# Patient Record
Sex: Male | Born: 1945 | Race: White | Hispanic: No | Smoking: Former smoker
Health system: Southern US, Community
[De-identification: ages and names within clinical notes are randomized; demographics above are authoritative.]

## PROBLEM LIST (undated history)

## (undated) DIAGNOSIS — I10 Essential (primary) hypertension: Secondary | ICD-10-CM

## (undated) DIAGNOSIS — M1990 Unspecified osteoarthritis, unspecified site: Secondary | ICD-10-CM

## (undated) DIAGNOSIS — R42 Dizziness and giddiness: Secondary | ICD-10-CM

## (undated) DIAGNOSIS — J449 Chronic obstructive pulmonary disease, unspecified: Secondary | ICD-10-CM

## (undated) HISTORY — PX: KNEE ARTHROSCOPY: SUR90

---

## 2003-01-16 ENCOUNTER — Emergency Department (HOSPITAL_COMMUNITY): Admission: EM | Admit: 2003-01-16 | Discharge: 2003-01-16 | Payer: Self-pay | Admitting: Emergency Medicine

## 2014-09-17 ENCOUNTER — Emergency Department (INDEPENDENT_AMBULATORY_CARE_PROVIDER_SITE_OTHER)
Admission: EM | Admit: 2014-09-17 | Discharge: 2014-09-17 | Disposition: A | Discharge status: Home / Self Care | Payer: 59 | Source: Home / Self Care | Attending: Family Medicine | Admitting: Family Medicine

## 2014-09-17 ENCOUNTER — Encounter (HOSPITAL_COMMUNITY): Payer: Self-pay | Admitting: Emergency Medicine

## 2014-09-17 DIAGNOSIS — I10 Essential (primary) hypertension: Secondary | ICD-10-CM

## 2014-09-17 DIAGNOSIS — I160 Hypertensive urgency: Secondary | ICD-10-CM

## 2014-09-17 HISTORY — DX: Essential (primary) hypertension: I10

## 2014-09-17 LAB — POCT I-STAT, CHEM 8
BUN: 10 mg/dL (ref 6–23)
CREATININE: 0.6 mg/dL (ref 0.50–1.35)
Calcium, Ion: 1.07 mmol/L — ABNORMAL LOW (ref 1.13–1.30)
Chloride: 104 mEq/L (ref 96–112)
GLUCOSE: 101 mg/dL — AB (ref 70–99)
HCT: 49 % (ref 39.0–52.0)
HEMOGLOBIN: 16.7 g/dL (ref 13.0–17.0)
POTASSIUM: 3.8 meq/L (ref 3.7–5.3)
Sodium: 138 mEq/L (ref 137–147)
TCO2: 22 mmol/L (ref 0–100)

## 2014-09-17 MED ORDER — CLONIDINE HCL 0.1 MG PO TABS
ORAL_TABLET | ORAL | Status: AC
  Filled 2014-09-17: qty 1

## 2014-09-17 MED ORDER — HYDROCHLOROTHIAZIDE 12.5 MG PO TABS: 12.5000 mg | ORAL_TABLET | Freq: Every day | ORAL | Status: DC

## 2014-09-17 MED ORDER — CLONIDINE HCL 0.1 MG PO TABS
0.1000 mg | ORAL_TABLET | Freq: Once | ORAL | Status: AC
  Administered 2014-09-17: 0.1 mg via ORAL

## 2014-09-17 NOTE — Discharge Instructions (Signed)
Your blood pressure was extremely elevated Please start the hydrochlorothiazide for your blood pressure This medicine may make you urinate more frequently Please call your primary doctor for a follow up appointment in 2 weeks Please stop the medicine if it makes you lightheaded or pass out.

## 2014-09-17 NOTE — ED Provider Notes (Addendum)
CSN: [id number]     Arrival date & time 09/17/14  1218 History   First MD Initiated Contact with Patient 09/17/14 1348     Chief Complaint  Patient presents with  . Hypertension   (Consider location/radiation/quality/duration/timing/severity/associated sxs/prior Treatment) HPI  Pt had eye appt today at work and was told BP was very high >200 systolic and that he needed to get checked out. Previously on BP medications (10 years ago) but stopped after BP normalized. Has had a few high BPs in the recent past but nothing as high as today. Deniea CP, palpitations, HA, syncope, lightheadedenss.    Past Medical History  Diagnosis Date  . Hypertension    Past Surgical History  Procedure Laterality Date  . Knee arthroscopy Right    Family History  Problem Relation Age of Onset  . Hypertension Mother   . Hypertension Father   . Hypertension Brother    History  Substance Use Topics  . Smoking status: Current Every Day Smoker -- 1.00 packs/day    Types: Cigarettes  . Smokeless tobacco: Never Used  . Alcohol Use: 4.2 oz/week    7 Shots of liquor per week    Review of Systems Per HPI with all other pertinent systems negative.   Allergies  Codeine  Home Medications   Prior to Admission medications   Medication Sig Start Date End Date Taking? Authorizing Provider  hydrochlorothiazide (HYDRODIURIL) 12.5 MG tablet Take 1 tablet (12.5 mg total) by mouth daily. 09/17/14   Ozella Rocksavid J Kemond Amorin, MD   BP 208/104 mmHg  Pulse 99  Temp(Src) 98.5 F (36.9 C) (Oral)  SpO2 96% Physical Exam  Constitutional: He is oriented to person, place, and time. He appears well-developed and well-nourished.  HENT:  Head: Normocephalic and atraumatic.  Eyes: EOM are normal. Pupils are equal, round, and reactive to light.  Neck: Normal range of motion.  Cardiovascular: Normal rate and normal heart sounds.   No murmur heard. Pulmonary/Chest: Effort normal and breath sounds normal. No respiratory  distress.  Abdominal: Soft. Bowel sounds are normal.  Musculoskeletal: Normal range of motion.  Neurological: He is alert and oriented to person, place, and time.  Skin: Skin is warm and dry. There is pallor.  Psychiatric: He has a normal mood and affect. His behavior is normal. Judgment and thought content normal.    ED Course  Procedures (including critical care time) Labs Review Labs Reviewed  POCT I-STAT, CHEM 8 - Abnormal; Notable for the following:    Glucose, Bld 101 (*)    Calcium, Ion 1.07 (*)    All other components within normal limits    Imaging Review No results found.   MDM   1. Hypertensive urgency    Clonidine 0.1mg  PO w/ safe reduction in BP (190/90) Pt to start HCTZ 12.5 today I stat Chem 8 w/ nml Cr and K Precautions given and all questions answered  Shelly Flattenavid Jaicee Michelotti, MD Family Medicine 09/17/2014, 2:28 PM      Ozella Rocksavid J Azhane Eckart, MD 09/17/14 1428  Ozella Rocksavid J Jesiel Garate, MD 09/17/14 1434

## 2014-09-17 NOTE — ED Notes (Signed)
Pt came from audiology with BP's 200/98 & 204/100 on recheck.  His BP here to day is 210/109.  Pt is asymptomatic.  He denies CP, SOB, dizziness, headache, or blurred vision.

## 2019-05-11 ENCOUNTER — Telehealth: Payer: Self-pay | Admitting: *Deleted

## 2019-05-11 ENCOUNTER — Ambulatory Visit: Payer: Medicare Other | Admitting: Neurology

## 2019-05-11 NOTE — Telephone Encounter (Signed)
No showed new patient appointment. 

## 2019-06-12 ENCOUNTER — Ambulatory Visit: Payer: Self-pay | Admitting: Physician Assistant

## 2019-06-12 NOTE — H&P (Signed)
TOTAL KNEE ADMISSION H&P  Patient is being admitted for right total knee arthroplasty.  Subjective:  Chief Complaint:right knee pain.  HPI: Hanad Edward Wieman, 73 y.o. male, has a history of pain and functional disability in the right knee due to arthritis and has failed non-surgical conservative treatments for greater than 12 weeks to includecorticosteriod injections and activity modification.  Onset of symptoms was gradual, starting 9 years ago with gradually worsening course since that time. The patient noted prior procedures on the knee to include  arthroscopy on the right knee(s).  Patient currently rates pain in the right knee(s) at 8 out of 10 with activity. Patient has night pain, worsening of pain with activity and weight bearing, pain that interferes with activities of daily living, pain with passive range of motion, crepitus and joint swelling.  Patient has evidence of periarticular osteophytes and joint space narrowing by imaging studies.There is no active infection.  There are no active problems to display for this patient.  Past Medical History:  Diagnosis Date  . Hypertension     Past Surgical History:  Procedure Laterality Date  . KNEE ARTHROSCOPY Right     No current outpatient medications on file.   No current facility-administered medications for this visit.    Allergies  Allergen Reactions  . Codeine Swelling    Social History   Tobacco Use  . Smoking status: Current Every Day Smoker    Packs/day: 1.00    Types: Cigarettes  . Smokeless tobacco: Never Used  Substance Use Topics  . Alcohol use: Yes    Alcohol/week: 7.0 standard drinks    Types: 7 Shots of liquor per week    Family History  Problem Relation Age of Onset  . Hypertension Mother   . Hypertension Father   . Hypertension Brother      Review of Systems  Constitutional: Positive for weight loss.  HENT: Positive for hearing loss.   Cardiovascular: Positive for leg swelling.   Musculoskeletal: Positive for joint pain.  Endo/Heme/Allergies: Bruises/bleeds easily.  All other systems reviewed and are negative.   Objective:  Physical Exam  Constitutional: He is oriented to person, place, and time. He appears well-developed and well-nourished. No distress.  HENT:  Head: Normocephalic and atraumatic.  Eyes: Pupils are equal, round, and reactive to light. Conjunctivae and EOM are normal.  Neck: Normal range of motion. Neck supple.  Cardiovascular: Normal rate, regular rhythm, normal heart sounds and intact distal pulses.  Respiratory: Accessory muscle usage present.  Diffuse stridor or rales secondary to COPD  GI: Soft. Bowel sounds are normal. He exhibits no distension. There is no abdominal tenderness.  Musculoskeletal:     Right knee: He exhibits swelling and bony tenderness. He exhibits no effusion and no erythema. Tenderness found.  Lymphadenopathy:    He has no cervical adenopathy.  Neurological: He is alert and oriented to person, place, and time.  Skin: Skin is warm and dry. No rash noted. No erythema.  Psychiatric: He has a normal mood and affect. His behavior is normal.    Vital signs in last 24 hours: @VSRANGES@  Labs:   There is no height or weight on file to calculate BMI.   Imaging Review Plain radiographs demonstrate severe degenerative joint disease of the right knee(s). The overall alignment ismild varus. The bone quality appears to be good for age and reported activity level.      Assessment/Plan:  End stage arthritis, right knee   The patient history, physical examination, clinical judgment   of the provider and imaging studies are consistent with end stage degenerative joint disease of the right knee(s) and total knee arthroplasty is deemed medically necessary. The treatment options including medical management, injection therapy arthroscopy and arthroplasty were discussed at length. The risks and benefits of total knee  arthroplasty were presented and reviewed. The risks due to aseptic loosening, infection, stiffness, patella tracking problems, thromboembolic complications and other imponderables were discussed. The patient acknowledged the explanation, agreed to proceed with the plan and consent was signed. Patient is being admitted for inpatient treatment for surgery, pain control, PT, OT, prophylactic antibiotics, VTE prophylaxis, progressive ambulation and ADL's and discharge planning. The patient is planning to be discharged home with home health services    Anticipated LOS equal to or greater than 2 midnights due to - Age 79 and older with one or more of the following:  - Obesity  - Expected need for hospital services (PT, OT, Nursing) required for safe  discharge  - Anticipated need for postoperative skilled nursing care or inpatient rehab  - Active co-morbidities: Respiratory Failure/COPD OR   - Unanticipated findings during/Post Surgery: None  - Patient is a high risk of re-admission due to: None

## 2019-06-12 NOTE — H&P (View-Only) (Signed)
TOTAL KNEE ADMISSION H&P  Patient is being admitted for right total knee arthroplasty.  Subjective:  Chief Complaint:right knee pain.  HPI: Melvin EllisRalph Edward Thomas, 73 y.o. male, has a history of pain and functional disability in the right knee due to arthritis and has failed non-surgical conservative treatments for greater than 12 weeks to includecorticosteriod injections and activity modification.  Onset of symptoms was gradual, starting 9 years ago with gradually worsening course since that time. The patient noted prior procedures on the knee to include  arthroscopy on the right knee(s).  Patient currently rates pain in the right knee(s) at 8 out of 10 with activity. Patient has night pain, worsening of pain with activity and weight bearing, pain that interferes with activities of daily living, pain with passive range of motion, crepitus and joint swelling.  Patient has evidence of periarticular osteophytes and joint space narrowing by imaging studies.There is no active infection.  There are no active problems to display for this patient.  Past Medical History:  Diagnosis Date  . Hypertension     Past Surgical History:  Procedure Laterality Date  . KNEE ARTHROSCOPY Right     No current outpatient medications on file.   No current facility-administered medications for this visit.    Allergies  Allergen Reactions  . Codeine Swelling    Social History   Tobacco Use  . Smoking status: Current Every Day Smoker    Packs/day: 1.00    Types: Cigarettes  . Smokeless tobacco: Never Used  Substance Use Topics  . Alcohol use: Yes    Alcohol/week: 7.0 standard drinks    Types: 7 Shots of liquor per week    Family History  Problem Relation Age of Onset  . Hypertension Mother   . Hypertension Father   . Hypertension Brother      Review of Systems  Constitutional: Positive for weight loss.  HENT: Positive for hearing loss.   Cardiovascular: Positive for leg swelling.   Musculoskeletal: Positive for joint pain.  Endo/Heme/Allergies: Bruises/bleeds easily.  All other systems reviewed and are negative.   Objective:  Physical Exam  Constitutional: He is oriented to person, place, and time. He appears well-developed and well-nourished. No distress.  HENT:  Head: Normocephalic and atraumatic.  Eyes: Pupils are equal, round, and reactive to light. Conjunctivae and EOM are normal.  Neck: Normal range of motion. Neck supple.  Cardiovascular: Normal rate, regular rhythm, normal heart sounds and intact distal pulses.  Respiratory: Accessory muscle usage present.  Diffuse stridor or rales secondary to COPD  GI: Soft. Bowel sounds are normal. He exhibits no distension. There is no abdominal tenderness.  Musculoskeletal:     Right knee: He exhibits swelling and bony tenderness. He exhibits no effusion and no erythema. Tenderness found.  Lymphadenopathy:    He has no cervical adenopathy.  Neurological: He is alert and oriented to person, place, and time.  Skin: Skin is warm and dry. No rash noted. No erythema.  Psychiatric: He has a normal mood and affect. His behavior is normal.    Vital signs in last 24 hours: @VSRANGES @  Labs:   There is no height or weight on file to calculate BMI.   Imaging Review Plain radiographs demonstrate severe degenerative joint disease of the right knee(s). The overall alignment ismild varus. The bone quality appears to be good for age and reported activity level.      Assessment/Plan:  End stage arthritis, right knee   The patient history, physical examination, clinical judgment  of the provider and imaging studies are consistent with end stage degenerative joint disease of the right knee(s) and total knee arthroplasty is deemed medically necessary. The treatment options including medical management, injection therapy arthroscopy and arthroplasty were discussed at length. The risks and benefits of total knee  arthroplasty were presented and reviewed. The risks due to aseptic loosening, infection, stiffness, patella tracking problems, thromboembolic complications and other imponderables were discussed. The patient acknowledged the explanation, agreed to proceed with the plan and consent was signed. Patient is being admitted for inpatient treatment for surgery, pain control, PT, OT, prophylactic antibiotics, VTE prophylaxis, progressive ambulation and ADL's and discharge planning. The patient is planning to be discharged home with home health services    Anticipated LOS equal to or greater than 2 midnights due to - Age 71 and older with one or more of the following:  - Obesity  - Expected need for hospital services (PT, OT, Nursing) required for safe  discharge  - Anticipated need for postoperative skilled nursing care or inpatient rehab  - Active co-morbidities: Respiratory Failure/COPD OR   - Unanticipated findings during/Post Surgery: None  - Patient is a high risk of re-admission due to: None

## 2019-06-14 NOTE — Patient Instructions (Addendum)
YOU NEED TO HAVE A COVID 19 TEST ON_8-25-20______ @_______ , THIS TEST MUST BE DONE BEFORE SURGERY, COME  [address], [place] , [phone]. ONCE YOUR COVID TEST IS COMPLETED, PLEASE BEGIN THE QUARANTINE INSTRUCTIONS AS OUTLINED IN YOUR HANDOUT.                Melvin EllisRalph Edward Thomas    Your procedure is scheduled on: 06-23-2019  [facility]              Report to admitting at 745 AM   1 VISITOR IS ALLOWED TO WAIT IN WAITING ROOM  ONLY DAY OF YOUR SURGERY. NO VISITORS ARE ALLOWED IN SHORT STAY OR RECOVERY ROOM.   Call this number if you have problems the morning of surgery [phone]    Remember: BRUSH YOUR TEETH MORNING OF SURGERY AND RINSE YOUR MOUTH OUT, NO CHEWING GUM CANDY OR MINTS.   NO SOLID FOOD AFTER MIDNIGHT THE NIGHT PRIOR TO SURGERY. NOTHING BY MOUTH EXCEPT CLEAR LIQUIDS UNTIL 715 AM . PLEASE FINISH ENSURE DRINK PER SURGEON ORDER  WHICH NEEDS TO BE COMPLETED AT 715 AM .  Then nothing by mouth   CLEAR LIQUID DIET   Foods Allowed                                                                     Foods Excluded  Coffee and tea, regular and decaf                             liquids that you cannot  Plain Jell-O any favor except red or purple                                           see through such as: Fruit ices (not with fruit pulp)                                     milk, soups, orange juice  Iced Popsicles                                    All solid food Carbonated beverages, regular and diet                                    Cranberry, grape and apple juices Sports drinks like Gatorade Lightly seasoned clear broth or consume(fat free) Sugar, honey syrup  Sample Menu Breakfast                                Lunch                                     Supper Cranberry juice  Beef broth                            Chicken broth Jell-O                                     Grape juice                            Apple juice Coffee or tea                        Jell-O                                      Popsicle                                                Coffee or tea                        Coffee or tea  _____________________________________________________________________     Take these medicines the morning of surgery with A SIP OF WATER: inhaler and bring with you                                You may not have any metal on your body including hair pins and              piercings  Do not wear jewelry,  lotions, powders or perfumes, deodorant                         Men may shave face and neck.   Do not bring valuables to the hospital. [place] IS NOT             RESPONSIBLE   FOR VALUABLES.  Contacts, dentures or bridgework may not be worn into surgery.    ____________________________________________________________________           [facility] - Preparing for Surgery Before surgery, you can play an important role.  Because skin is not sterile, your skin needs to be as free of germs as possible.  You can reduce the number of germs on your skin by washing with CHG (chlorahexidine gluconate) soap before surgery.  CHG is an antiseptic cleaner which kills germs and bonds with the skin to continue killing germs even after washing. Please DO NOT use if you have an allergy to CHG or antibacterial soaps.  If your skin becomes reddened/irritated stop using the CHG and inform your nurse when you arrive at Short Stay. Do not shave (including legs and underarms) for at least 48 hours prior to the first CHG shower.  You may shave your face/neck. Please follow these instructions carefully:  1.  Shower with CHG Soap the night before surgery and the  morning of Surgery.  2.  If you choose to wash your hair, wash your hair first as usual with your  normal  shampoo.  3.  After you shampoo, rinse your hair  and body thoroughly to remove the  shampoo.                           4.  Use CHG as you would  any other liquid soap.  You can apply chg directly  to the skin and wash                       Gently with a scrungie or clean washcloth.  5.  Apply the CHG Soap to your body ONLY FROM THE NECK DOWN.   Do not use on face/ open                           Wound or open sores. Avoid contact with eyes, ears mouth and genitals (private parts).                       Wash face,  Genitals (private parts) with your normal soap.             6.  Wash thoroughly, paying special attention to the area where your surgery  will be performed.  7.  Thoroughly rinse your body with warm water from the neck down.  8.  DO NOT shower/wash with your normal soap after using and rinsing off  the CHG Soap.                9.  Pat yourself dry with a clean towel.            10.  Wear clean pajamas.            11.  Place clean sheets on your bed the night of your first shower and do not  sleep with pets. Day of Surgery : Do not apply any lotions/deodorants the morning of surgery.  Please wear clean clothes to the hospital/surgery center.  FAILURE TO FOLLOW THESE INSTRUCTIONS MAY RESULT IN THE CANCELLATION OF YOUR SURGERY PATIENT SIGNATURE_________________________________  NURSE SIGNATURE__________________________________  ________________________________________________________________________   Melvin MireIncentive Spirometer  An incentive spirometer is a tool that can help keep your lungs clear and active. This tool measures how well you are filling your lungs with each breath. Taking long deep breaths may help reverse or decrease the chance of developing breathing (pulmonary) problems (especially infection) following:  A long period of time when you are unable to move or be active. BEFORE THE PROCEDURE   If the spirometer includes an indicator to show your best effort, your nurse or respiratory therapist will set it to a desired goal.  If possible, sit up straight or lean slightly forward. Try not to slouch.  Hold the  incentive spirometer in an upright position. INSTRUCTIONS FOR USE  1. Sit on the edge of your bed if possible, or sit up as far as you can in bed or on a chair. 2. Hold the incentive spirometer in an upright position. 3. Breathe out normally. 4. Place the mouthpiece in your mouth and seal your lips tightly around it. 5. Breathe in slowly and as deeply as possible, raising the piston or the ball toward the top of the column. 6. Hold your breath for 3-5 seconds or for as long as possible. Allow the piston or ball to fall to the bottom of the column. 7. Remove the mouthpiece from your mouth and breathe out normally. 8. Rest for a few seconds and repeat Steps  1 through 7 at least 10 times every 1-2 hours when you are awake. Take your time and take a few normal breaths between deep breaths. 9. The spirometer may include an indicator to show your best effort. Use the indicator as a goal to work toward during each repetition. 10. After each set of 10 deep breaths, practice coughing to be sure your lungs are clear. If you have an incision (the cut made at the time of surgery), support your incision when coughing by placing a pillow or rolled up towels firmly against it. Once you are able to get out of bed, walk around indoors and cough well. You may stop using the incentive spirometer when instructed by your caregiver.  RISKS AND COMPLICATIONS  Take your time so you do not get dizzy or light-headed.  If you are in pain, you may need to take or ask for pain medication before doing incentive spirometry. It is harder to take a deep breath if you are having pain. AFTER USE  Rest and breathe slowly and easily.  It can be helpful to keep track of a log of your progress. Your caregiver can provide you with a simple table to help with this. If you are using the spirometer at home, follow these instructions: SEEK MEDICAL CARE IF:   You are having difficultly using the spirometer.  You have trouble using  the spirometer as often as instructed.  Your pain medication is not giving enough relief while using the spirometer.  You develop fever of 100.5 F (38.1 C) or higher. SEEK IMMEDIATE MEDICAL CARE IF:   You cough up bloody sputum that had not been present before.  You develop fever of 102 F (38.9 C) or greater.  You develop worsening pain at or near the incision site. MAKE SURE YOU:   Understand these instructions.  Will watch your condition.  Will get help right away if you are not doing well or get worse. Document Released: 02/22/2007 Document Revised: 01/04/2012 Document Reviewed: 04/25/2007 ExitCare Patient Information 2014 [employer], [place].   ________________________________________________________________________  WHAT IS A BLOOD TRANSFUSION? Blood Transfusion Information  A transfusion is the replacement of blood or some of its parts. Blood is made up of multiple cells which provide different functions.  Red blood cells carry oxygen and are used for blood loss replacement.  White blood cells fight against infection.  Platelets control bleeding.  Plasma helps clot blood.  Other blood products are available for specialized needs, such as hemophilia or other clotting disorders. BEFORE THE TRANSFUSION  Who gives blood for transfusions?   Healthy volunteers who are fully evaluated to make sure their blood is safe. This is blood bank blood. Transfusion therapy is the safest it has ever been in the practice of medicine. Before blood is taken from a donor, a complete history is taken to make sure that person has no history of diseases nor engages in risky social behavior (examples are intravenous drug use or sexual activity with multiple partners). The donor's travel history is screened to minimize risk of transmitting infections, such as malaria. The donated blood is tested for signs of infectious diseases, such as HIV and hepatitis. The blood is then tested to be sure it  is compatible with you in order to minimize the chance of a transfusion reaction. If you or a relative donates blood, this is often done in anticipation of surgery and is not appropriate for emergency situations. It takes many days to process the donated blood. RISKS  AND COMPLICATIONS Although transfusion therapy is very safe and saves many lives, the main dangers of transfusion include:   Getting an infectious disease.  Developing a transfusion reaction. This is an allergic reaction to something in the blood you were given. Every precaution is taken to prevent this. The decision to have a blood transfusion has been considered carefully by your caregiver before blood is given. Blood is not given unless the benefits outweigh the risks. AFTER THE TRANSFUSION  Right after receiving a blood transfusion, you will usually feel much better and more energetic. This is especially true if your red blood cells have gotten low (anemic). The transfusion raises the level of the red blood cells which carry oxygen, and this usually causes an energy increase.  The nurse administering the transfusion will monitor you carefully for complications. HOME CARE INSTRUCTIONS  No special instructions are needed after a transfusion. You may find your energy is better. Speak with your caregiver about any limitations on activity for underlying diseases you may have. SEEK MEDICAL CARE IF:   Your condition is not improving after your transfusion.  You develop redness or irritation at the intravenous (IV) site. SEEK IMMEDIATE MEDICAL CARE IF:  Any of the following symptoms occur over the next 12 hours:  Shaking chills.  You have a temperature by mouth above 102 F (38.9 C), not controlled by medicine.  Chest, back, or muscle pain.  People around you feel you are not acting correctly or are confused.  Shortness of breath or difficulty breathing.  Dizziness and fainting.  You get a rash or develop hives.  You  have a decrease in urine output.  Your urine turns a dark color or changes to pink, red, or brown. Any of the following symptoms occur over the next 10 days:  You have a temperature by mouth above 102 F (38.9 C), not controlled by medicine.  Shortness of breath.  Weakness after normal activity.  The white part of the eye turns yellow (jaundice).  You have a decrease in the amount of urine or are urinating less often.  Your urine turns a dark color or changes to pink, red, or brown. Document Released: 10/09/2000 Document Revised: 01/04/2012 Document Reviewed: 05/28/2008 [facility] Patient Information 2014 [place], [place].  _______________________________________________________________________

## 2019-06-15 ENCOUNTER — Encounter (INDEPENDENT_AMBULATORY_CARE_PROVIDER_SITE_OTHER): Payer: Self-pay

## 2019-06-15 ENCOUNTER — Encounter (HOSPITAL_COMMUNITY): Payer: Self-pay

## 2019-06-15 ENCOUNTER — Other Ambulatory Visit: Payer: Self-pay

## 2019-06-15 ENCOUNTER — Encounter (HOSPITAL_COMMUNITY)
Admission: RE | Admit: 2019-06-15 | Discharge: 2019-06-15 | Disposition: A | Discharge status: Home / Self Care | Payer: Medicare Other | Source: Ambulatory Visit | Attending: Orthopedic Surgery | Admitting: Orthopedic Surgery

## 2019-06-15 DIAGNOSIS — R9431 Abnormal electrocardiogram [ECG] [EKG]: Secondary | ICD-10-CM | POA: Insufficient documentation

## 2019-06-15 DIAGNOSIS — M1711 Unilateral primary osteoarthritis, right knee: Secondary | ICD-10-CM | POA: Diagnosis not present

## 2019-06-15 DIAGNOSIS — Z01818 Encounter for other preprocedural examination: Secondary | ICD-10-CM | POA: Insufficient documentation

## 2019-06-15 HISTORY — DX: Dizziness and giddiness: R42

## 2019-06-15 HISTORY — DX: Chronic obstructive pulmonary disease, unspecified: J44.9

## 2019-06-15 HISTORY — DX: Unspecified osteoarthritis, unspecified site: M19.90

## 2019-06-15 LAB — COMPREHENSIVE METABOLIC PANEL
ALT: 41 U/L (ref 0–44)
AST: 106 U/L — ABNORMAL HIGH (ref 15–41)
Albumin: 3.8 g/dL (ref 3.5–5.0)
Alkaline Phosphatase: 141 U/L — ABNORMAL HIGH (ref 38–126)
Anion gap: 9 (ref 5–15)
BUN: 10 mg/dL (ref 8–23)
CO2: 27 mmol/L (ref 22–32)
Calcium: 8.6 mg/dL — ABNORMAL LOW (ref 8.9–10.3)
Chloride: 102 mmol/L (ref 98–111)
Creatinine, Ser: 0.67 mg/dL (ref 0.61–1.24)
GFR calc Af Amer: >60 mL/min (ref >60)
GFR calc non Af Amer: >60 mL/min (ref >60)
Glucose, Bld: 100 mg/dL — ABNORMAL HIGH (ref 70–99)
Potassium: 4.5 mmol/L (ref 3.5–5.1)
Sodium: 138 mmol/L (ref 135–145)
Total Bilirubin: 1.4 mg/dL — ABNORMAL HIGH (ref 0.3–1.2)
Total Protein: 7.4 g/dL (ref 6.5–8.1)

## 2019-06-15 LAB — CBC WITH DIFFERENTIAL/PLATELET
Abs Immature Granulocytes: 0.03 10*3/uL (ref 0.00–0.07)
Basophils Absolute: 0 10*3/uL (ref 0.0–0.1)
Basophils Relative: 1 %
Eosinophils Absolute: 0.1 10*3/uL (ref 0.0–0.5)
Eosinophils Relative: 1 %
HCT: 46.6 % (ref 39.0–52.0)
Hemoglobin: 15.8 g/dL (ref 13.0–17.0)
Immature Granulocytes: 0 %
Lymphocytes Relative: 24 %
Lymphs Abs: 1.6 10*3/uL (ref 0.7–4.0)
MCH: 38.7 pg — ABNORMAL HIGH (ref 26.0–34.0)
MCHC: 33.9 g/dL (ref 30.0–36.0)
MCV: 114.2 fL — ABNORMAL HIGH (ref 80.0–100.0)
Monocytes Absolute: 0.9 10*3/uL (ref 0.1–1.0)
Monocytes Relative: 13 %
Neutro Abs: 4.2 10*3/uL (ref 1.7–7.7)
Neutrophils Relative %: 61 %
Platelets: 134 10*3/uL — ABNORMAL LOW (ref 150–400)
RBC: 4.08 MIL/uL — ABNORMAL LOW (ref 4.22–5.81)
RDW: 15.9 % — ABNORMAL HIGH (ref 11.5–15.5)
WBC: 6.8 10*3/uL (ref 4.0–10.5)
nRBC: 0 % (ref 0.0–0.2)

## 2019-06-15 LAB — URINALYSIS, ROUTINE W REFLEX MICROSCOPIC
Glucose, UA: NEGATIVE mg/dL
Hgb urine dipstick: NEGATIVE
Ketones, ur: 5 mg/dL — AB
Nitrite: NEGATIVE
Protein, ur: NEGATIVE mg/dL
Specific Gravity, Urine: 1.021 (ref 1.005–1.030)
WBC, UA: >50 WBC/hpf — ABNORMAL HIGH (ref 0–5)
pH: 5 (ref 5.0–8.0)

## 2019-06-15 LAB — ABO/RH: ABO/RH(D): O POS

## 2019-06-15 LAB — PROTIME-INR
INR: 1 (ref 0.8–1.2)
Prothrombin Time: 12.9 seconds (ref 11.4–15.2)

## 2019-06-15 LAB — SURGICAL PCR SCREEN
MRSA, PCR: NEGATIVE
Staphylococcus aureus: POSITIVE — AB

## 2019-06-15 LAB — APTT: aPTT: 34 seconds (ref 24–36)

## 2019-06-16 LAB — URINE CULTURE: Culture: NO GROWTH

## 2019-06-20 ENCOUNTER — Other Ambulatory Visit (HOSPITAL_COMMUNITY)
Admission: RE | Admit: 2019-06-20 | Discharge: 2019-06-20 | Disposition: A | Discharge status: Home / Self Care | Payer: Medicare Other | Source: Ambulatory Visit | Attending: Orthopedic Surgery | Admitting: Orthopedic Surgery

## 2019-06-20 DIAGNOSIS — Z01812 Encounter for preprocedural laboratory examination: Secondary | ICD-10-CM | POA: Diagnosis not present

## 2019-06-20 DIAGNOSIS — Z20828 Contact with and (suspected) exposure to other viral communicable diseases: Secondary | ICD-10-CM | POA: Diagnosis not present

## 2019-06-20 LAB — SARS CORONAVIRUS 2 (TAT 6-24 HRS): SARS Coronavirus 2: NEGATIVE

## 2019-06-22 MED ORDER — BUPIVACAINE LIPOSOME 1.3 % IJ SUSP
20.0000 mL | Freq: Once | INTRAMUSCULAR | Status: DC
  Filled 2019-06-22: qty 20

## 2019-06-22 MED ORDER — TRANEXAMIC ACID 1000 MG/10ML IV SOLN
2000.0000 mg | INTRAVENOUS | Status: DC
  Filled 2019-06-22: qty 20

## 2019-06-23 ENCOUNTER — Ambulatory Visit (HOSPITAL_COMMUNITY): Payer: Medicare Other | Admitting: Physician Assistant

## 2019-06-23 ENCOUNTER — Observation Stay (HOSPITAL_COMMUNITY)
Admission: RE | Admit: 2019-06-23 | Discharge: 2019-06-24 | Disposition: A | Discharge status: Home Health | Payer: Medicare Other | Source: Other Acute Inpatient Hospital | Attending: Orthopedic Surgery | Admitting: Orthopedic Surgery

## 2019-06-23 ENCOUNTER — Other Ambulatory Visit: Payer: Self-pay

## 2019-06-23 ENCOUNTER — Encounter (HOSPITAL_COMMUNITY): Payer: Self-pay

## 2019-06-23 ENCOUNTER — Ambulatory Visit (HOSPITAL_COMMUNITY): Payer: Medicare Other | Admitting: Registered Nurse

## 2019-06-23 ENCOUNTER — Encounter (HOSPITAL_COMMUNITY)
Admission: RE | Disposition: A | Discharge status: Home Health | Payer: Self-pay | Source: Other Acute Inpatient Hospital | Attending: Orthopedic Surgery

## 2019-06-23 DIAGNOSIS — M24561 Contracture, right knee: Secondary | ICD-10-CM | POA: Insufficient documentation

## 2019-06-23 DIAGNOSIS — M1711 Unilateral primary osteoarthritis, right knee: Principal | ICD-10-CM | POA: Diagnosis present

## 2019-06-23 DIAGNOSIS — I10 Essential (primary) hypertension: Secondary | ICD-10-CM | POA: Insufficient documentation

## 2019-06-23 DIAGNOSIS — Z79899 Other long term (current) drug therapy: Secondary | ICD-10-CM | POA: Diagnosis not present

## 2019-06-23 DIAGNOSIS — F17210 Nicotine dependence, cigarettes, uncomplicated: Secondary | ICD-10-CM | POA: Diagnosis not present

## 2019-06-23 DIAGNOSIS — J449 Chronic obstructive pulmonary disease, unspecified: Secondary | ICD-10-CM | POA: Insufficient documentation

## 2019-06-23 DIAGNOSIS — M25761 Osteophyte, right knee: Secondary | ICD-10-CM | POA: Diagnosis not present

## 2019-06-23 DIAGNOSIS — M25461 Effusion, right knee: Secondary | ICD-10-CM | POA: Diagnosis not present

## 2019-06-23 DIAGNOSIS — M21161 Varus deformity, not elsewhere classified, right knee: Secondary | ICD-10-CM | POA: Insufficient documentation

## 2019-06-23 HISTORY — PX: TOTAL KNEE ARTHROPLASTY: SHX125

## 2019-06-23 LAB — TYPE AND SCREEN
ABO/RH(D): O POS
Antibody Screen: NEGATIVE

## 2019-06-23 SURGERY — ARTHROPLASTY, KNEE, TOTAL
Anesthesia: Spinal | Laterality: Right

## 2019-06-23 MED ORDER — PROPOFOL 10 MG/ML IV BOLUS
INTRAVENOUS | Status: DC | PRN
  Administered 2019-06-23: 20 mg via INTRAVENOUS
  Administered 2019-06-23: 30 mg via INTRAVENOUS

## 2019-06-23 MED ORDER — TRANEXAMIC ACID-NACL 1000-0.7 MG/100ML-% IV SOLN
1000.0000 mg | Freq: Once | INTRAVENOUS | Status: AC
  Administered 2019-06-23: 15:00:00 1000 mg via INTRAVENOUS
  Filled 2019-06-23: qty 100

## 2019-06-23 MED ORDER — HYDROMORPHONE HCL 1 MG/ML IJ SOLN: 0.5000 mg | INTRAMUSCULAR | Status: DC | PRN

## 2019-06-23 MED ORDER — VANCOMYCIN HCL IN DEXTROSE 1-5 GM/200ML-% IV SOLN
1000.0000 mg | Freq: Two times a day (BID) | INTRAVENOUS | Status: AC
  Administered 2019-06-23: 21:00:00 1000 mg via INTRAVENOUS
  Filled 2019-06-23: qty 200

## 2019-06-23 MED ORDER — 0.9 % SODIUM CHLORIDE (POUR BTL) OPTIME
TOPICAL | Status: DC | PRN
  Administered 2019-06-23: 1000 mL

## 2019-06-23 MED ORDER — DOCUSATE SODIUM 100 MG PO CAPS: 100.0000 mg | ORAL_CAPSULE | Freq: Every day | ORAL | 2 refills | Status: DC | PRN

## 2019-06-23 MED ORDER — LACTATED RINGERS IV SOLN: INTRAVENOUS | Status: DC | PRN

## 2019-06-23 MED ORDER — METOCLOPRAMIDE HCL 5 MG PO TABS: 5.0000 mg | ORAL_TABLET | Freq: Three times a day (TID) | ORAL | Status: DC | PRN

## 2019-06-23 MED ORDER — DOCUSATE SODIUM 100 MG PO CAPS
100.0000 mg | ORAL_CAPSULE | Freq: Two times a day (BID) | ORAL | Status: DC
  Administered 2019-06-23 – 2019-06-24 (×2): 100 mg via ORAL
  Filled 2019-06-23 (×2): qty 1

## 2019-06-23 MED ORDER — METOCLOPRAMIDE HCL 5 MG/ML IJ SOLN: 5.0000 mg | Freq: Three times a day (TID) | INTRAMUSCULAR | Status: DC | PRN

## 2019-06-23 MED ORDER — UMECLIDINIUM-VILANTEROL 62.5-25 MCG/INH IN AEPB
1.0000 | INHALATION_SPRAY | Freq: Every day | RESPIRATORY_TRACT | Status: DC
  Filled 2019-06-23: qty 14

## 2019-06-23 MED ORDER — BUPIVACAINE-EPINEPHRINE 0.5% -1:200000 IJ SOLN
INTRAMUSCULAR | Status: AC
  Filled 2019-06-23: qty 1

## 2019-06-23 MED ORDER — BUPIVACAINE IN DEXTROSE 0.75-8.25 % IT SOLN
INTRATHECAL | Status: DC | PRN
  Administered 2019-06-23: 1.6 mL via INTRATHECAL

## 2019-06-23 MED ORDER — PROPOFOL 10 MG/ML IV BOLUS
INTRAVENOUS | Status: AC
  Filled 2019-06-23: qty 20

## 2019-06-23 MED ORDER — KETOROLAC TROMETHAMINE 15 MG/ML IJ SOLN: 15.0000 mg | Freq: Once | INTRAMUSCULAR | Status: DC

## 2019-06-23 MED ORDER — DEXAMETHASONE SODIUM PHOSPHATE 10 MG/ML IJ SOLN
INTRAMUSCULAR | Status: DC | PRN
  Administered 2019-06-23: 8 mg via INTRAVENOUS

## 2019-06-23 MED ORDER — ROPIVACAINE HCL 5 MG/ML IJ SOLN
INTRAMUSCULAR | Status: DC | PRN
  Administered 2019-06-23 (×2): 5 mL via PERINEURAL

## 2019-06-23 MED ORDER — CEFAZOLIN SODIUM-DEXTROSE 2-4 GM/100ML-% IV SOLN
2.0000 g | INTRAVENOUS | Status: AC
  Administered 2019-06-23: 11:00:00 2 g via INTRAVENOUS
  Filled 2019-06-23: qty 100

## 2019-06-23 MED ORDER — LACTATED RINGERS IV SOLN
INTRAVENOUS | Status: DC | PRN
  Administered 2019-06-23: 12:00:00 via INTRAVENOUS

## 2019-06-23 MED ORDER — TRANEXAMIC ACID 1000 MG/10ML IV SOLN
INTRAVENOUS | Status: DC | PRN
  Administered 2019-06-23: 2000 mg via TOPICAL

## 2019-06-23 MED ORDER — ACETAMINOPHEN 325 MG PO TABS: 325.0000 mg | ORAL_TABLET | Freq: Four times a day (QID) | ORAL | Status: DC | PRN

## 2019-06-23 MED ORDER — VANCOMYCIN HCL 1000 MG IV SOLR
INTRAVENOUS | Status: DC | PRN
  Administered 2019-06-23: 2000 mg

## 2019-06-23 MED ORDER — EPHEDRINE SULFATE 50 MG/ML IJ SOLN
INTRAMUSCULAR | Status: DC | PRN
  Administered 2019-06-23 (×2): 10 mg via INTRAVENOUS

## 2019-06-23 MED ORDER — SODIUM CHLORIDE 0.9 % IV SOLN
INTRAVENOUS | Status: DC
  Administered 2019-06-23: 75 mL/h via INTRAVENOUS
  Administered 2019-06-24: 04:00:00 via INTRAVENOUS

## 2019-06-23 MED ORDER — ACETAMINOPHEN 325 MG PO TABS: 650.0000 mg | ORAL_TABLET | ORAL | 2 refills | Status: DC | PRN

## 2019-06-23 MED ORDER — ONDANSETRON HCL 4 MG PO TABS: 4.0000 mg | ORAL_TABLET | Freq: Four times a day (QID) | ORAL | Status: DC | PRN

## 2019-06-23 MED ORDER — CLONIDINE HCL (ANALGESIA) 100 MCG/ML EP SOLN
EPIDURAL | Status: DC | PRN
  Administered 2019-06-23: 50 ug

## 2019-06-23 MED ORDER — FENTANYL CITRATE (PF) 100 MCG/2ML IJ SOLN
50.0000 ug | INTRAMUSCULAR | Status: DC
  Administered 2019-06-23 (×2): 50 ug via INTRAVENOUS
  Filled 2019-06-23: qty 2

## 2019-06-23 MED ORDER — VANCOMYCIN HCL IN DEXTROSE 1-5 GM/200ML-% IV SOLN
INTRAVENOUS | Status: AC
  Filled 2019-06-23: qty 200

## 2019-06-23 MED ORDER — MENTHOL 3 MG MT LOZG: 1.0000 | LOZENGE | OROMUCOSAL | Status: DC | PRN

## 2019-06-23 MED ORDER — ONDANSETRON HCL 4 MG/2ML IJ SOLN
INTRAMUSCULAR | Status: DC | PRN
  Administered 2019-06-23: 4 mg via INTRAVENOUS

## 2019-06-23 MED ORDER — OXYCODONE HCL 5 MG PO TABS
5.0000 mg | ORAL_TABLET | ORAL | Status: DC | PRN
  Administered 2019-06-23: 5 mg via ORAL
  Administered 2019-06-24 (×2): 10 mg via ORAL
  Filled 2019-06-23: qty 1
  Filled 2019-06-23 (×2): qty 2

## 2019-06-23 MED ORDER — TRANEXAMIC ACID-NACL 1000-0.7 MG/100ML-% IV SOLN
1000.0000 mg | INTRAVENOUS | Status: AC
  Administered 2019-06-23: 11:00:00 1000 mg via INTRAVENOUS
  Filled 2019-06-23: qty 100

## 2019-06-23 MED ORDER — VANCOMYCIN HCL 1000 MG IV SOLR
INTRAVENOUS | Status: AC
  Filled 2019-06-23: qty 2000

## 2019-06-23 MED ORDER — PROPOFOL 500 MG/50ML IV EMUL
INTRAVENOUS | Status: DC | PRN
  Administered 2019-06-23 (×2): 75 ug/kg/min via INTRAVENOUS

## 2019-06-23 MED ORDER — MEPERIDINE HCL 50 MG/ML IJ SOLN: 6.2500 mg | INTRAMUSCULAR | Status: DC | PRN

## 2019-06-23 MED ORDER — PROPOFOL 10 MG/ML IV BOLUS
INTRAVENOUS | Status: AC
  Filled 2019-06-23: qty 60

## 2019-06-23 MED ORDER — OXYCODONE HCL 5 MG PO TABS: ORAL_TABLET | ORAL | 0 refills | Status: DC

## 2019-06-23 MED ORDER — SODIUM CHLORIDE (PF) 0.9 % IJ SOLN
INTRAMUSCULAR | Status: AC
  Filled 2019-06-23: qty 50

## 2019-06-23 MED ORDER — WATER FOR IRRIGATION, STERILE IR SOLN
Status: DC | PRN
  Administered 2019-06-23: 2000 mL

## 2019-06-23 MED ORDER — VANCOMYCIN HCL IN DEXTROSE 1-5 GM/200ML-% IV SOLN
1000.0000 mg | Freq: Once | INTRAVENOUS | Status: AC
  Administered 2019-06-23: 11:00:00 1000 mg via INTRAVENOUS

## 2019-06-23 MED ORDER — CHLORHEXIDINE GLUCONATE 4 % EX LIQD: 60.0000 mL | Freq: Once | CUTANEOUS | Status: DC

## 2019-06-23 MED ORDER — PROMETHAZINE HCL 25 MG/ML IJ SOLN: 6.2500 mg | INTRAMUSCULAR | Status: DC | PRN

## 2019-06-23 MED ORDER — PHENOL 1.4 % MT LIQD: 1.0000 | OROMUCOSAL | Status: DC | PRN

## 2019-06-23 MED ORDER — BUPIVACAINE-EPINEPHRINE 0.5% -1:200000 IJ SOLN
INTRAMUSCULAR | Status: DC | PRN
  Administered 2019-06-23: 30 mL

## 2019-06-23 MED ORDER — ASPIRIN EC 81 MG PO TBEC: 81.0000 mg | DELAYED_RELEASE_TABLET | Freq: Two times a day (BID) | ORAL | 0 refills | Status: DC

## 2019-06-23 MED ORDER — ACETAMINOPHEN 500 MG PO TABS
1000.0000 mg | ORAL_TABLET | Freq: Four times a day (QID) | ORAL | Status: AC
  Administered 2019-06-23 – 2019-06-24 (×4): 1000 mg via ORAL
  Filled 2019-06-23 (×4): qty 2

## 2019-06-23 MED ORDER — POVIDONE-IODINE 10 % EX SWAB: 2.0000 "application " | Freq: Once | CUTANEOUS | Status: DC

## 2019-06-23 MED ORDER — DEXAMETHASONE SODIUM PHOSPHATE 10 MG/ML IJ SOLN
INTRAMUSCULAR | Status: AC
  Filled 2019-06-23: qty 1

## 2019-06-23 MED ORDER — LIDOCAINE 2% (20 MG/ML) 5 ML SYRINGE
INTRAMUSCULAR | Status: AC
  Filled 2019-06-23: qty 5

## 2019-06-23 MED ORDER — MIDAZOLAM HCL 2 MG/2ML IJ SOLN
1.0000 mg | INTRAMUSCULAR | Status: DC
  Administered 2019-06-23 (×2): 1 mg via INTRAVENOUS
  Filled 2019-06-23: qty 2

## 2019-06-23 MED ORDER — BISACODYL 10 MG RE SUPP: 10.0000 mg | Freq: Every day | RECTAL | Status: DC | PRN

## 2019-06-23 MED ORDER — BUPIVACAINE LIPOSOME 1.3 % IJ SUSP
INTRAMUSCULAR | Status: DC | PRN
  Administered 2019-06-23: 20 mL

## 2019-06-23 MED ORDER — ONDANSETRON HCL 4 MG/2ML IJ SOLN
INTRAMUSCULAR | Status: AC
  Filled 2019-06-23: qty 2

## 2019-06-23 MED ORDER — LIDOCAINE 2% (20 MG/ML) 5 ML SYRINGE
INTRAMUSCULAR | Status: DC | PRN
  Administered 2019-06-23: 40 mg via INTRAVENOUS

## 2019-06-23 MED ORDER — DIPHENHYDRAMINE HCL 12.5 MG/5ML PO ELIX: 12.5000 mg | ORAL_SOLUTION | ORAL | Status: DC | PRN

## 2019-06-23 MED ORDER — FENTANYL CITRATE (PF) 100 MCG/2ML IJ SOLN: 25.0000 ug | INTRAMUSCULAR | Status: DC | PRN

## 2019-06-23 MED ORDER — POLYETHYLENE GLYCOL 3350 17 G PO PACK: 17.0000 g | PACK | Freq: Every day | ORAL | Status: DC | PRN

## 2019-06-23 MED ORDER — SODIUM CHLORIDE 0.9 % IV SOLN
INTRAVENOUS | Status: DC
  Administered 2019-06-23: 08:00:00 via INTRAVENOUS
  Administered 2019-06-23: 1000 mL via INTRAVENOUS

## 2019-06-23 MED ORDER — ONDANSETRON HCL 4 MG/2ML IJ SOLN: 4.0000 mg | Freq: Four times a day (QID) | INTRAMUSCULAR | Status: DC | PRN

## 2019-06-23 MED ORDER — SODIUM CHLORIDE 0.9% FLUSH
INTRAVENOUS | Status: DC | PRN
  Administered 2019-06-23: 50 mL

## 2019-06-23 MED ORDER — SODIUM CHLORIDE 0.9 % IR SOLN
Status: DC | PRN
  Administered 2019-06-23: 1000 mL

## 2019-06-23 MED ORDER — ASPIRIN 81 MG PO CHEW
81.0000 mg | CHEWABLE_TABLET | Freq: Two times a day (BID) | ORAL | Status: DC
  Administered 2019-06-23 – 2019-06-24 (×2): 81 mg via ORAL
  Filled 2019-06-23 (×2): qty 1

## 2019-06-23 MED ORDER — FLEET ENEMA 7-19 GM/118ML RE ENEM: 1.0000 | ENEMA | Freq: Once | RECTAL | Status: DC | PRN

## 2019-06-23 MED ORDER — ROPIVACAINE HCL 7.5 MG/ML IJ SOLN
INTRAMUSCULAR | Status: DC | PRN
  Administered 2019-06-23 (×4): 5 mL via PERINEURAL

## 2019-06-23 SURGICAL SUPPLY — 66 items
ATTUNE MED DOME PAT 41 KNEE (Knees) ×1 IMPLANT
ATTUNE MED DOME PAT 41MM KNEE (Knees) ×1 IMPLANT
ATTUNE PS FEM RT SZ 6 CEM KNEE (Femur) ×2 IMPLANT
ATTUNE PSRP INSR SZ6 6 KNEE (Insert) ×1 IMPLANT
ATTUNE PSRP INSR SZ6 6MM KNEE (Insert) ×1 IMPLANT
BAG DECANTER FOR FLEXI CONT (MISCELLANEOUS) ×3 IMPLANT
BAG ZIPLOCK 12X15 (MISCELLANEOUS) ×3 IMPLANT
BASE TIBIA ATTUNE KNEE SYS SZ6 (Knees) IMPLANT
BENZOIN TINCTURE PRP APPL 2/3 (GAUZE/BANDAGES/DRESSINGS) ×3 IMPLANT
BLADE SAGITTAL 25.0X1.19X90 (BLADE) ×2 IMPLANT
BLADE SAGITTAL 25.0X1.19X90MM (BLADE) ×1
BLADE SAW SGTL 13X75X1.27 (BLADE) ×3 IMPLANT
BLADE SURG 15 STRL LF DISP TIS (BLADE) ×1 IMPLANT
BLADE SURG 15 STRL SS (BLADE) ×2
BLADE SURG SZ10 CARB STEEL (BLADE) ×9 IMPLANT
BNDG ELASTIC 6X15 VLCR STRL LF (GAUZE/BANDAGES/DRESSINGS) ×3 IMPLANT
BOWL SMART MIX CTS (DISPOSABLE) ×3 IMPLANT
CATH FOLEY 2WAY SLVR  5CC 14FR (CATHETERS) ×2
CATH FOLEY 2WAY SLVR 5CC 14FR (CATHETERS) IMPLANT
CEMENT HV SMART SET (Cement) ×4 IMPLANT
CLOSURE STERI-STRIP 1/2X4 (GAUZE/BANDAGES/DRESSINGS) ×2
CLSR STERI-STRIP ANTIMIC 1/2X4 (GAUZE/BANDAGES/DRESSINGS) ×4 IMPLANT
COVER SURGICAL LIGHT HANDLE (MISCELLANEOUS) ×5 IMPLANT
COVER WAND RF STERILE (DRAPES) ×3 IMPLANT
CUFF TOURN SGL QUICK 34 (TOURNIQUET CUFF) ×2
CUFF TRNQT CYL 34X4.125X (TOURNIQUET CUFF) ×1 IMPLANT
DECANTER SPIKE VIAL GLASS SM (MISCELLANEOUS) ×6 IMPLANT
DRAPE INCISE IOBAN 66X45 STRL (DRAPES) IMPLANT
DRAPE ORTHO SPLIT 77X108 STRL (DRAPES) ×4
DRAPE SURG ORHT 6 SPLT 77X108 (DRAPES) ×2 IMPLANT
DRAPE U-SHAPE 47X51 STRL (DRAPES) ×3 IMPLANT
DRESSING AQUACEL AG SP 3.5X10 (GAUZE/BANDAGES/DRESSINGS) ×1 IMPLANT
DRSG AQUACEL AG SP 3.5X10 (GAUZE/BANDAGES/DRESSINGS) ×3
DURAPREP 26ML APPLICATOR (WOUND CARE) ×6 IMPLANT
ELECT REM PT RETURN 15FT ADLT (MISCELLANEOUS) ×3 IMPLANT
GLOVE BIOGEL PI IND STRL 8 (GLOVE) ×2 IMPLANT
GLOVE BIOGEL PI INDICATOR 8 (GLOVE) ×4
GLOVE SURG ORTHO 8.0 STRL STRW (GLOVE) ×3 IMPLANT
GLOVE SURG SS PI 7.5 STRL IVOR (GLOVE) ×3 IMPLANT
GOWN STRL REUS W/TWL XL LVL3 (GOWN DISPOSABLE) ×6 IMPLANT
HANDPIECE INTERPULSE COAX TIP (DISPOSABLE) ×2
HOLDER FOLEY CATH W/STRAP (MISCELLANEOUS) IMPLANT
HOOD PEEL AWAY FLYTE STAYCOOL (MISCELLANEOUS) ×3 IMPLANT
IMMOBILIZER KNEE 20 (SOFTGOODS) ×3
IMMOBILIZER KNEE 20 THIGH 36 (SOFTGOODS) ×1 IMPLANT
KIT TURNOVER KIT A (KITS) IMPLANT
MANIFOLD NEPTUNE II (INSTRUMENTS) ×3 IMPLANT
NEEDLE HYPO 22GX1.5 SAFETY (NEEDLE) ×6 IMPLANT
NS IRRIG 1000ML POUR BTL (IV SOLUTION) ×3 IMPLANT
PACK ICE MAXI GEL EZY WRAP (MISCELLANEOUS) ×3 IMPLANT
PACK TOTAL KNEE CUSTOM (KITS) ×3 IMPLANT
PIN DRILL FIX HALF THREAD (BIT) ×2 IMPLANT
PIN STEINMAN FIXATION KNEE (PIN) ×2 IMPLANT
PROTECTOR NERVE ULNAR (MISCELLANEOUS) ×3 IMPLANT
SET HNDPC FAN SPRY TIP SCT (DISPOSABLE) ×1 IMPLANT
SUT ETHIBOND NAB CT1 #1 30IN (SUTURE) ×9 IMPLANT
SUT MNCRL AB 3-0 PS2 18 (SUTURE) ×3 IMPLANT
SUT VIC AB 0 CT1 36 (SUTURE) ×3 IMPLANT
SUT VIC AB 2-0 CT1 27 (SUTURE) ×4
SUT VIC AB 2-0 CT1 TAPERPNT 27 (SUTURE) ×2 IMPLANT
SYR CONTROL 10ML LL (SYRINGE) ×6 IMPLANT
TIBIA ATTUNE KNEE SYS BASE SZ6 (Knees) ×3 IMPLANT
TRAY FOLEY MTR SLVR 16FR STAT (SET/KITS/TRAYS/PACK) ×3 IMPLANT
WATER STERILE IRR 1000ML POUR (IV SOLUTION) ×6 IMPLANT
WRAP KNEE MAXI GEL POST OP (GAUZE/BANDAGES/DRESSINGS) ×2 IMPLANT
YANKAUER SUCT BULB TIP 10FT TU (MISCELLANEOUS) ×3 IMPLANT

## 2019-06-23 NOTE — Transfer of Care (Signed)
Immediate Anesthesia Transfer of Care Note  Patient: Melvin Thomas  Procedure(s) Performed: TOTAL KNEE ARTHROPLASTY (Right )  Patient Location: PACU  Anesthesia Type:Spinal  Level of Consciousness: awake, alert  and oriented  Airway & Oxygen Therapy: Patient Spontanous Breathing and Patient connected to face mask oxygen  Post-op Assessment: Report given to RN and Post -op Vital signs reviewed and stable  Post vital signs: Reviewed and stable  Last Vitals:  Vitals Value Taken Time  BP    Temp    Pulse    Resp    SpO2      Last Pain:  Vitals:   06/23/19 0812  TempSrc:   PainSc: 1       Patients Stated Pain Goal: 4 (64/40/34 7425)  Complications: No apparent anesthesia complications

## 2019-06-23 NOTE — Anesthesia Procedure Notes (Signed)
Procedure Name: MAC Date/Time: 06/23/2019 11:26 AM Performed by: Eben Burow, CRNA Pre-anesthesia Checklist: Patient identified, Emergency Drugs available, Suction available, Patient being monitored and Timeout performed Oxygen Delivery Method: Simple face mask Dental Injury: Teeth and Oropharynx as per pre-operative assessment

## 2019-06-23 NOTE — Anesthesia Preprocedure Evaluation (Signed)
Anesthesia Evaluation  Patient identified by MRN, date of birth, ID band Patient awake    Reviewed: Allergy & Precautions, NPO status , Patient's Chart, lab work & pertinent test results  Airway Mallampati: I       Dental no notable dental hx. (+) Teeth Intact   Pulmonary COPD,  COPD inhaler, former smoker,    Pulmonary exam normal breath sounds clear to auscultation       Cardiovascular hypertension, Normal cardiovascular exam Rhythm:Regular Rate:Normal     Neuro/Psych negative neurological ROS  negative psych ROS   GI/Hepatic negative GI ROS, Neg liver ROS,   Endo/Other  negative endocrine ROS  Renal/GU negative Renal ROS  negative genitourinary   Musculoskeletal  (+) Arthritis , Osteoarthritis,    Abdominal Normal abdominal exam  (+)   Peds  Hematology negative hematology ROS (+)   Anesthesia Other Findings   Reproductive/Obstetrics                             Anesthesia Physical Anesthesia Plan  ASA: II  Anesthesia Plan: Spinal   Post-op Pain Management:  Regional for Post-op pain   Induction:   PONV Risk Score and Plan: 1 and Ondansetron, Dexamethasone and Midazolam  Airway Management Planned: Nasal Cannula and Natural Airway  Additional Equipment: None  Intra-op Plan:   Post-operative Plan:   Informed Consent: I have reviewed the patients History and Physical, chart, labs and discussed the procedure including the risks, benefits and alternatives for the proposed anesthesia with the patient or authorized representative who has indicated his/her understanding and acceptance.       Plan Discussed with: CRNA  Anesthesia Plan Comments:         Anesthesia Quick Evaluation

## 2019-06-23 NOTE — Evaluation (Signed)
Physical Therapy Evaluation Patient Details Name: Melvin Thomas MRN: [id number] DOB: 14-Feb-1946 Today's Date: 06/23/2019   History of Present Illness  73 yo male s/p R TKR on 06/23/19. PMH includes HTN, R knee arthroscopy.  Clinical Impression   Pt presents with R knee pain, decreased R knee ROM, difficulty performing mobility tasks, increased time and effort to mobilize, and decreased activity tolerance. Pt to benefit from acute PT to address deficits. Pt ambulated hallway distance with RW with min guard assist, verbal cuing for form and safety provided throughout. Pt educated on ankle pumps (20/hour) to perform this afternoon/evening to increase circulation, to pt's tolerance and limited by pain. PT to progress mobility as tolerated, and will continue to follow acutely.        Follow Up Recommendations Follow surgeon's recommendation for DC plan and follow-up therapies;Supervision for mobility/OOB(HHPT)    Equipment Recommendations  None recommended by PT    Recommendations for Other Services       Precautions / Restrictions Precautions Precautions: Fall Required Braces or Orthoses: Knee Immobilizer - Right Knee Immobilizer - Right: On when out of bed or walking Restrictions Weight Bearing Restrictions: No Other Position/Activity Restrictions: WBAT      Mobility  Bed Mobility Overal bed mobility: Needs Assistance Bed Mobility: Supine to Sit     Supine to sit: Min guard;HOB elevated     General bed mobility comments: Min guard for safety, verbal cuing for sequencing.  Transfers Overall transfer level: Needs assistance Equipment used: Rolling walker (2 wheeled) Transfers: Sit to/from Stand Sit to Stand: Min guard;From elevated surface         General transfer comment: Min guard for safety, verbal cuing for hand placement when rising.  Ambulation/Gait Ambulation/Gait assistance: Min guard Gait Distance (Feet): 80 Feet Assistive device: Rolling walker (2  wheeled) Gait Pattern/deviations: Step-to pattern;Decreased step length - right;Decreased step length - left;Antalgic;Trunk flexed Gait velocity: decr   General Gait Details: Min guard for safety, verbal cuing for sequencing, placement in RW, turning with RW, and upright posture.  Stairs            Wheelchair Mobility    Modified Rankin (Stroke Patients Only)       Balance Overall balance assessment: Mild deficits observed, not formally tested                                           Pertinent Vitals/Pain Pain Assessment: 0-10 Pain Score: 1  Pain Location: R knee Pain Descriptors / Indicators: Sore Pain Intervention(s): Limited activity within patient's tolerance;Monitored during session;Premedicated before session;Repositioned;Ice applied    Home Living Family/patient expects to be discharged to:: Private residence Living Arrangements: Alone Available Help at Discharge: Family;Available PRN/intermittently Type of Home: House Home Access: Stairs to enter   [employer] of Steps: 2+1 Home Layout: One level Home Equipment: Walker - 2 wheels;Cane - single point;Bedside commode      Prior Function Level of Independence: Independent with assistive device(s)         Comments: pt reports using cane for ambulation as needed PTA     Hand Dominance   Dominant Hand: Right    Extremity/Trunk Assessment   Upper Extremity Assessment Upper Extremity Assessment: Overall WFL for tasks assessed    Lower Extremity Assessment Lower Extremity Assessment: Overall WFL for tasks assessed;RLE deficits/detail RLE Deficits / Details: suspected post-surgical weakness; able to perform ankle  pumps, quad set, heel slide to 45* (limited by incisional bleeding), SLR without lift assist RLE Sensation: WNL    Cervical / Trunk Assessment Cervical / Trunk Assessment: Normal  Communication   Communication: No difficulties  Cognition Arousal/Alertness:  Awake/alert Behavior During Therapy: WFL for tasks assessed/performed Overall Cognitive Status: Within Functional Limits for tasks assessed                                        General Comments      Exercises     Assessment/Plan    PT Assessment Patient needs continued PT services  PT Problem List Decreased strength;Decreased mobility;Decreased range of motion;Decreased activity tolerance;Decreased balance;Decreased knowledge of use of DME;Pain       PT Treatment Interventions DME instruction;Therapeutic activities;Gait training;Therapeutic exercise;Patient/family education;Balance training;Functional mobility training;Stair training    PT Goals (Current goals can be found in the Care Plan section)  Acute Rehab PT Goals Patient Stated Goal: go home PT Goal Formulation: With patient Time For Goal Achievement: 06/30/19 Potential to Achieve Goals: Good    Frequency 7X/week   Barriers to discharge        Co-evaluation               AM-PAC PT "6 Clicks" Mobility  Outcome Measure Help needed turning from your back to your side while in a flat bed without using bedrails?: A Little Help needed moving from lying on your back to sitting on the side of a flat bed without using bedrails?: A Little Help needed moving to and from a bed to a chair (including a wheelchair)?: A Little Help needed standing up from a chair using your arms (e.g., wheelchair or bedside chair)?: A Little Help needed to walk in hospital room?: A Little Help needed climbing 3-5 steps with a railing? : A Little 6 Click Score: 18    End of Session Equipment Utilized During Treatment: Gait belt;Right knee immobilizer Activity Tolerance: Patient tolerated treatment well Patient left: in chair;with chair alarm set;with call bell/phone within reach;with SCD's reapplied(SCD on RLE not applied due to bleeding from dressing, reinforced by RN) Nurse Communication: Mobility status PT Visit  Diagnosis: Difficulty in walking, not elsewhere classified (R26.2);Other abnormalities of gait and mobility (R26.89)    Time: [id number] PT Time Calculation (min) (ACUTE ONLY): 20 min   Charges:   PT Evaluation $PT Eval Low Complexity: 1 Low         Maylani Embree Terrial Rhodes Atonya Templer, PT Acute Rehabilitation Services Pager [phone]  Office [phone]  Telina Kleckley D Despina Hiddenure 06/23/2019, 5:35 PM

## 2019-06-23 NOTE — Plan of Care (Signed)
  Problem: Education: Goal: Knowledge of General Education information will improve Description: Including pain rating scale, medication(s)/side effects and non-pharmacologic comfort measures Outcome: Progressing   Problem: Health Behavior/Discharge Planning: Goal: Ability to manage health-related needs will improve Outcome: Progressing   Problem: Activity: Goal: Risk for activity intolerance will decrease Outcome: Progressing   Problem: Pain Managment: Goal: General experience of comfort will improve Outcome: Progressing   Problem: Education: Goal: Knowledge of the prescribed therapeutic regimen will improve Outcome: Progressing

## 2019-06-23 NOTE — Anesthesia Procedure Notes (Signed)
Spinal  Patient location during procedure: OR Start time: 06/23/2019 11:10 AM End time: 06/23/2019 11:14 AM Staffing Anesthesiologist: Lyn Hollingshead, MD Resident/CRNA: Talbot Grumbling, CRNA Performed: resident/CRNA  Preanesthetic Checklist Completed: patient identified, site marked, surgical consent, pre-op evaluation, timeout performed, IV checked, risks and benefits discussed and monitors and equipment checked Spinal Block Patient position: sitting Prep: DuraPrep Patient monitoring: heart rate, cardiac monitor, continuous pulse ox and blood pressure Approach: midline Location: L3-4 Injection technique: single-shot Needle Needle type: Pencan  Needle gauge: 24 G Needle length: 9 cm Assessment Sensory level: T4 Additional Notes Clear CSF, no paresthesia, patient tolerated well.

## 2019-06-23 NOTE — Brief Op Note (Signed)
06/23/2019  1:12 PM  PATIENT:  Ina Homes  73 y.o. male  PRE-OPERATIVE DIAGNOSIS:  OA RIGHT KNEE  POST-OPERATIVE DIAGNOSIS:  OA RIGHT KNEE  PROCEDURE:  Procedure(s): TOTAL KNEE ARTHROPLASTY (Right)  SURGEON:  Surgeon(s) and Role:    Earlie Server, MD - Primary  PHYSICIAN ASSISTANT: Chriss Czar, PA-C  ASSISTANTS: OR staff x1   ANESTHESIA:   local, regional, spinal and IV sedation  EBL:  50 mL   BLOOD ADMINISTERED:none  DRAINS: none   LOCAL MEDICATIONS USED:  MARCAINE     SPECIMEN:  Source of Specimen:  synovium right knee and Biopsy / Limited Resection  DISPOSITION OF SPECIMEN:  PATHOLOGY  COUNTS:  YES  TOURNIQUET:   Total Tourniquet Time Documented: Thigh (Right) - 53 minutes Total: Thigh (Right) - 53 minutes   DICTATION: .Other Dictation: Dictation Number unknown  PLAN OF CARE: Admit for overnight observation  PATIENT DISPOSITION:  PACU - hemodynamically stable.   Delay start of Pharmacological VTE agent (>24hrs) due to surgical blood loss or risk of bleeding: yes

## 2019-06-23 NOTE — Interval H&P Note (Signed)
History and Physical Interval Note:  06/23/2019 9:58 AM  Melvin Thomas  has presented today for surgery, with the diagnosis of OA RIGHT KNEE.  The various methods of treatment have been discussed with the patient and family. After consideration of risks, benefits and other options for treatment, the patient has consented to  Procedure(s): TOTAL KNEE ARTHROPLASTY (Right) as a surgical intervention.  The patient's history has been reviewed, patient examined, no change in status, stable for surgery.  I have reviewed the patient's chart and labs.  Questions were answered to the patient's satisfaction.     Yvette Rack

## 2019-06-23 NOTE — Anesthesia Procedure Notes (Signed)
Anesthesia Regional Block: Adductor canal block   Pre-Anesthetic Checklist: ,, timeout performed, Correct Patient, Correct Site, Correct Laterality, Correct Procedure, Correct Position, site marked, Risks and benefits discussed,  Surgical consent,  Pre-op evaluation,  At surgeon's request and post-op pain management  Laterality: Lower and Right  Prep: chloraprep       Needles:  Injection technique: Single-shot  Needle Type: Echogenic Stimulator Needle     Needle Length: 10cm  Needle Gauge: 21   Needle insertion depth: 2 cm   Additional Needles:   Procedures:,,,, ultrasound used (permanent image in chart),,,,  Narrative:  Start time: 06/23/2019 9:00 AM End time: 06/23/2019 9:10 AM Injection made incrementally with aspirations every 5 mL.  Performed by: Personally  Anesthesiologist: Lyn Hollingshead, MD

## 2019-06-23 NOTE — Op Note (Signed)
Melvin Thomas, Melvin Thomas MEDICAL RECORD NI:[id number] ACCOUNT [id number] DATE OF BIRTH:[birth year] FACILITY: [facility] LOCATION: [facility] PHYSICIAN:W. Ravleen Ries JR., MD  OPERATIVE REPORT  DATE OF PROCEDURE:  06/23/2019  PREOPERATIVE  DIAGNOSIS:  Severe osteoarthritis, right knee with varus deformity, flexion contracture.  OPERATION:  Right total knee replacement (Attune cemented knee size 6 femur, tibia size 6 mm bearing with 41 mm all poly patella.  SURGEON:  Vangie Bicker, MD  ASSISTANTMarjo Bicker.  ANESTHESIA:  Spinal anesthetic.  TOURNIQUET TIME:  50 minutes.  DESCRIPTION OF PROCEDURE:  Straight skin incision with medial parapatellar approach through the knee, extended medial release due to the severe varus deformity and flexion contracture.  I did a 9 mm 5 degree valgus cut on the femur followed by cutting  about 9 mm below the least diseased lateral compartment.  Extension gap with full extension noted at 6 mm.  Femur was sized to be a size 6 with placement of all-in-1 cutting block in appropriate degree of external rotation followed by placement of the  anterior, posterior chamfer cuts.  PCL was completely released.  Excess menisci and posterior osteophytes removed.  Flexion gap equaled the extension gap at 6 mm.  A keel hole was cut for the tibia followed by placement of tibial trial.  Box cut for the  femur and then the trial femur.  The patella was cut, resecting 9.5 mm patella for a 41 mm all poly trial.  Full extension was noted on the table with good stability and no tendency for bearing instability was noted.  Final components were inserted after  the trial tibia followed by femur and patella.  The cement was allowed to harden with the trial insert in.  The trial insert was removed.  When the cement was hardened, small bits of cement were removed from the posterior aspect of the knee.  Tourniquet  was released under direct vision, no excessive bleeding was noted.  Final  bearing was placed.  Again, stability deemed to be excellent.  Closure was affected with #1 Ethibond, 2-0 Vicryl and Monocryl.  A lightly compressive sterile dressing applied,  taken to recovery room in stable condition.  TN/NUANCE  D:06/23/2019 T:06/23/2019 JOB:[id number]

## 2019-06-23 NOTE — Anesthesia Postprocedure Evaluation (Signed)
Anesthesia Post Note  Patient: Sael Furches  Procedure(s) Performed: TOTAL KNEE ARTHROPLASTY (Right )     Patient location during evaluation: PACU Anesthesia Type: Spinal Level of consciousness: awake Pain management: pain level controlled Vital Signs Assessment: post-procedure vital signs reviewed and stable Respiratory status: spontaneous breathing Cardiovascular status: stable Postop Assessment: no headache, no backache, spinal receding and no apparent nausea or vomiting Anesthetic complications: no    Last Vitals:  Vitals:   06/23/19 1415 06/23/19 1430  BP: 133/71 137/72  Pulse: 68 68  Resp: 18 16  Temp: 36.5 C 36.5 C  SpO2: 96% 96%    Last Pain:  Vitals:   06/23/19 1430  TempSrc: Oral  PainSc:    Pain Goal: Patients Stated Pain Goal: 4 (06/23/19 0812)    LLE Sensation: Decreased (06/23/19 1415)   RLE Sensation: Decreased (06/23/19 1415) L Sensory Level: L3-Anterior knee, lower leg (06/23/19 1415) R Sensory Level: L3-Anterior knee, lower leg (06/23/19 1415)    Huston Foley

## 2019-06-23 NOTE — Progress Notes (Signed)
Assisted Dr. Hatchett with right, ultrasound guided, adductor canal block. Side rails up, monitors on throughout procedure. See vital signs in flow sheet. Tolerated Procedure well.  

## 2019-06-23 NOTE — Discharge Instructions (Signed)

## 2019-06-24 DIAGNOSIS — M1711 Unilateral primary osteoarthritis, right knee: Secondary | ICD-10-CM | POA: Diagnosis not present

## 2019-06-24 LAB — BASIC METABOLIC PANEL
Anion gap: 9 (ref 5–15)
BUN: 12 mg/dL (ref 8–23)
CO2: 23 mmol/L (ref 22–32)
Calcium: 7.8 mg/dL — ABNORMAL LOW (ref 8.9–10.3)
Chloride: 104 mmol/L (ref 98–111)
Creatinine, Ser: 0.7 mg/dL (ref 0.61–1.24)
GFR calc Af Amer: >60 mL/min (ref >60)
GFR calc non Af Amer: >60 mL/min (ref >60)
Glucose, Bld: 134 mg/dL — ABNORMAL HIGH (ref 70–99)
Potassium: 4.5 mmol/L (ref 3.5–5.1)
Sodium: 136 mmol/L (ref 135–145)

## 2019-06-24 LAB — CBC
HCT: 36.3 % — ABNORMAL LOW (ref 39.0–52.0)
Hemoglobin: 12.1 g/dL — ABNORMAL LOW (ref 13.0–17.0)
MCH: 38.1 pg — ABNORMAL HIGH (ref 26.0–34.0)
MCHC: 33.3 g/dL (ref 30.0–36.0)
MCV: 114.2 fL — ABNORMAL HIGH (ref 80.0–100.0)
Platelets: 132 10*3/uL — ABNORMAL LOW (ref 150–400)
RBC: 3.18 MIL/uL — ABNORMAL LOW (ref 4.22–5.81)
RDW: 14.9 % (ref 11.5–15.5)
WBC: 5.2 10*3/uL (ref 4.0–10.5)
nRBC: 0 % (ref 0.0–0.2)

## 2019-06-24 MED ORDER — POLYETHYLENE GLYCOL 3350 17 G PO PACK: PACK | ORAL | 0 refills | Status: DC

## 2019-06-24 MED ORDER — ACETAMINOPHEN 325 MG PO TABS: 650.0000 mg | ORAL_TABLET | ORAL | 2 refills | Status: AC | PRN

## 2019-06-24 MED ORDER — SODIUM CHLORIDE 0.9 % IV BOLUS
500.0000 mL | Freq: Once | INTRAVENOUS | Status: AC
  Administered 2019-06-24: 09:00:00 500 mL via INTRAVENOUS

## 2019-06-24 MED ORDER — OXYCODONE HCL 5 MG PO TABS: ORAL_TABLET | ORAL | 0 refills | Status: AC

## 2019-06-24 MED ORDER — DOCUSATE SODIUM 100 MG PO CAPS: 100.0000 mg | ORAL_CAPSULE | Freq: Two times a day (BID) | ORAL | 0 refills | Status: AC

## 2019-06-24 MED ORDER — POLYETHYLENE GLYCOL 3350 17 G PO PACK: PACK | ORAL | 0 refills | Status: AC

## 2019-06-24 MED ORDER — ASPIRIN EC 81 MG PO TBEC: 81.0000 mg | DELAYED_RELEASE_TABLET | Freq: Two times a day (BID) | ORAL | 0 refills | Status: AC

## 2019-06-24 NOTE — Evaluation (Signed)
Occupational Therapy Evaluation Patient Details Name: Melvin EllisRalph Edward Thomas MRN: [id number] DOB: 02/10/1946 Today's Date: 06/24/2019    History of Present Illness 73 yo male s/p R TKR on 06/23/19. PMH includes HTN, R knee arthroscopy.   Clinical Impression   Pt mod I with ADL activity.  Education provided for LB dressing as well as technique for getting in and out of shower    Follow Up Recommendations  No OT follow up    Equipment Recommendations  None recommended by OT    Recommendations for Other Services       Precautions / Restrictions Precautions Precautions: Fall Required Braces or Orthoses: Knee Immobilizer - Right Knee Immobilizer - Right: On when out of bed or walking Restrictions Weight Bearing Restrictions: No Other Position/Activity Restrictions: WBAT      Mobility Bed Mobility               General bed mobility comments: pt in chair  Transfers Overall transfer level: Modified independent                    Balance Overall balance assessment: Mild deficits observed, not formally tested                                         ADL either performed or assessed with clinical judgement   ADL Overall ADL's : Modified independent                                       General ADL Comments: education provided with ADL activity. Pt mod I with LB dressing, toilet transfer and shower transfer.     Vision Patient Visual Report: No change from baseline              Pertinent Vitals/Pain Pain Assessment: No/denies pain     Hand Dominance Right   Extremity/Trunk Assessment     Lower Extremity Assessment RLE Sensation: WNL   Cervical / Trunk Assessment Cervical / Trunk Assessment: Normal   Communication Communication Communication: No difficulties   Cognition Arousal/Alertness: Awake/alert Behavior During Therapy: WFL for tasks assessed/performed Overall Cognitive Status: Within Functional Limits for  tasks assessed                                                Home Living Family/patient expects to be discharged to:: Private residence Living Arrangements: Alone Available Help at Discharge: Family;Available PRN/intermittently Type of Home: House Home Access: Stairs to enter Entergy CorporationEntrance Stairs-Number of Steps: 2+1   Home Layout: One level     Bathroom Shower/Tub: Walk-in shower;Tub only   Bathroom Toilet: Handicapped height     Home Equipment: Walker - 2 wheels;Cane - single point;Bedside commode          Prior Functioning/Environment Level of Independence: Independent with assistive device(s)        Comments: pt reports using cane for ambulation as needed PTA                 OT Goals(Current goals can be found in the care plan section) Acute Rehab OT Goals Patient Stated Goal: home today. OT Goal Formulation: With patient  OT Frequency:  AM-PAC OT "6 Clicks" Daily Activity     Outcome Measure Help from another person eating meals?: None Help from another person taking care of personal grooming?: None Help from another person toileting, which includes using toliet, bedpan, or urinal?: None Help from another person bathing (including washing, rinsing, drying)?: None Help from another person to put on and taking off regular upper body clothing?: None Help from another person to put on and taking off regular lower body clothing?: None 6 Click Score: 24   End of Session Equipment Utilized During Treatment: Rolling walker CPM Right Knee CPM Right Knee: Off Nurse Communication: Mobility status  Activity Tolerance: Patient tolerated treatment well Patient left: in chair                   Time: 1130-1140 OT Time Calculation (min): 10 min Charges:  OT General Charges $OT Visit: 1 Visit OT Evaluation $OT Eval Low Complexity: 1 Low  Kari Baars, OT Acute Rehabilitation Services [phone] Office- [phone], [place] 06/24/2019, 12:26 PM

## 2019-06-24 NOTE — Discharge Summary (Signed)
Patient ID: Melvin EllisRalph Edward Thomas MRN: [id number] DOB/AGE: [birth year] 73 y.o.  Admit date: 06/23/2019 Discharge date: 06/24/2019  Admission Diagnoses:  Active Problems:   Primary localized osteoarthritis of right knee   Discharge Diagnoses:  Same  Past Medical History:  Diagnosis Date  . Arthritis   . COPD (chronic obstructive pulmonary disease) (HCC)    Dr. Clelia Cro[place]Shaw  [place]   . Hypertension    no meds  Patient states at preop he has  NEVER  taken any bp meds  . Vertigo     Surgeries: Procedure(s): TOTAL KNEE ARTHROPLASTY on 06/23/2019   Consultants:   Discharged Condition: Improved  Hospital Course: Melvin EllisRalph Edward Thomas is an 73 y.o. male who was admitted 06/23/2019 for operative treatment of<principal problem not specified>. Patient has severe unremitting pain that affects sleep, daily activities, and work/hobbies. After pre-op clearance the patient was taken to the operating room on 06/23/2019 and underwent  Procedure(s): TOTAL KNEE ARTHROPLASTY.    Patient was given perioperative antibiotics:  Anti-infectives (From admission, onward)   Start     Dose/Rate Route Frequency Ordered Stop   06/23/19 2000  vancomycin (VANCOCIN) IVPB 1000 mg/200 mL premix     1,000 mg 200 mL/hr over 60 Minutes Intravenous Every 12 hours 06/23/19 1442 06/23/19 2214   06/23/19 1220  vancomycin (VANCOCIN) powder  Status:  Discontinued       As needed 06/23/19 1221 06/23/19 1314   06/23/19 1100  vancomycin (VANCOCIN) IVPB 1000 mg/200 mL premix     1,000 mg 200 mL/hr over 60 Minutes Intravenous  Once 06/23/19 1042 06/23/19 1145   06/23/19 1044  vancomycin (VANCOCIN) 1-5 GM/200ML-% IVPB    Note to Pharmacy: Augustine RadarGaines, Janel   : cabinet override      06/23/19 1044 06/23/19 1045   06/23/19 0800  ceFAZolin (ANCEF) IVPB 2g/100 mL premix     2 g 200 mL/hr over 30 Minutes Intravenous On call to O.R. 06/23/19 0746 06/23/19 1135       Patient was given sequential compression devices, early ambulation, and  chemoprophylaxis to prevent DVT.  Patient benefited maximally from hospital stay and there were no complications.    Recent vital signs:  Patient Vitals for the past 24 hrs:  BP Temp Temp src Pulse Resp SpO2 Height Weight  06/24/19 0454 127/76 97.7 F (36.5 C) Oral 79 16 97 % - -  06/24/19 0152 124/65 97.8 F (36.6 C) Oral 72 16 95 % - -  06/23/19 2141 111/69 97.9 F (36.6 C) Oral 70 16 97 % - -  06/23/19 1739 116/64 97.6 F (36.4 C) - 79 16 94 % - -  06/23/19 1630 132/80 97.7 F (36.5 C) - 79 15 98 % - -  06/23/19 1527 (!) 152/84 - - 67 15 96 % - -  06/23/19 1430 137/72 97.7 F (36.5 C) Oral 68 16 96 % 5\' 4"  (1.626 m) 71.2 kg  06/23/19 1415 133/71 97.7 F (36.5 C) - 68 18 96 % - -  06/23/19 1400 132/73 - - 68 17 97 % - -  06/23/19 1345 138/69 - - 68 14 96 % - -  06/23/19 1336 - - - - - 94 % - -  06/23/19 1330 (!) 125/93 - - 71 15 90 % - -  06/23/19 1316 132/79 (!) 97.5 F (36.4 C) - 76 20 98 % - -  06/23/19 1020 111/68 - - 61 14 100 % - -  06/23/19 1015 (!) 114/59 - -  62 20 100 % - -  06/23/19 1010 103/64 - - 70 18 100 % - -  06/23/19 1005 (!) 87/52 - - (!) 57 14 100 % - -  06/23/19 1000 (!) 95/51 - - (!) 59 15 100 % - -  06/23/19 0955 (!) 92/53 - - 62 14 100 % - -  06/23/19 0950 (!) 96/52 - - 61 14 100 % - -  06/23/19 0945 (!) 87/52 - - (!) 58 14 100 % - -  06/23/19 0940 (!) 88/57 - - 60 14 100 % - -  06/23/19 0935 (!) 92/55 - - 65 15 100 % - -  06/23/19 0930 (!) 98/59 - - 61 14 100 % - -  06/23/19 0925 115/67 - - 70 18 100 % - -  06/23/19 0920 (!) 114/57 - - 69 18 100 % - -  06/23/19 0916 - - - 69 17 100 % - -  06/23/19 0915 (!) 104/57 - - 64 12 100 % - -  06/23/19 0914 - - - 69 16 100 % - -  06/23/19 0913 - - - 62 14 100 % - -  06/23/19 0912 - - - 65 15 100 % - -  06/23/19 0911 - - - 67 17 98 % - -  06/23/19 0910 (!) 94/51 - - 63 16 99 % - -  06/23/19 0909 - - - 63 14 99 % - -  06/23/19 0908 - - - 66 15 99 % - -  06/23/19 0907 - - - 65 15 99 % - -  06/23/19 0906  - - - 65 17 100 % - -  06/23/19 0905 (!) 96/56 - - 67 15 (!) 78 % - -  06/23/19 0904 - - - 71 16 100 % - -  06/23/19 0903 - - - 65 16 100 % - -  06/23/19 0902 - - - 63 16 100 % - -  06/23/19 0901 - - - 65 16 100 % - -  06/23/19 0900 (!) 116/58 - - 69 16 100 % - -  06/23/19 0859 - - - 67 16 100 % - -  06/23/19 0858 116/61 - - 68 19 100 % - -  06/23/19 0857 - - - 67 16 100 % - -  06/23/19 0856 - - - 71 18 100 % - -  06/23/19 0855 - - - 82 (!) 21 100 % - -  06/23/19 0854 - - - 79 (!) 24 100 % - -  06/23/19 0853 - - - 69 19 100 % - -  06/23/19 0852 - - - 67 14 100 % - -  06/23/19 0851 137/69 - - 74 (!) 24 97 % - -     Recent laboratory studies:  Recent Labs    06/24/19 0251  WBC 5.2  HGB 12.1*  HCT 36.3*  PLT 132*  NA 136  K 4.5  CL 104  CO2 23  BUN 12  CREATININE 0.70  GLUCOSE 134*  CALCIUM 7.8*     Discharge Medications:   Allergies as of 06/24/2019      Reactions   Codeine Swelling      Medication List    TAKE these medications   acetaminophen 325 MG tablet Commonly known as: Tylenol Take 2 tablets (650 mg total) by mouth every 4 (four) hours as needed.   Anoro Ellipta 62.5-25 MCG/INH Aepb Generic drug: umeclidinium-vilanterol Inhale 1 puff into the lungs daily.  aspirin EC 81 MG tablet Take 1 tablet (81 mg total) by mouth 2 (two) times daily. TO PREVENT BLOOD CLOTS   docusate sodium 100 MG capsule Commonly known as: Colace Take 1 capsule (100 mg total) by mouth daily as needed.   oxyCODONE 5 MG immediate release tablet Commonly known as: Oxy IR/ROXICODONE Take one tab po q4-6hrs prn pain, may need 1-2 first couple weeks   polyethylene glycol 17 g packet Commonly known as: MIRALAX / GLYCOLAX 17grams in 6 oz of something twice a day until bowel movement.  LAXITIVE.  Restart if two days since last bowel movement            Durable Medical Equipment  (From admission, onward)         Start     Ordered   06/23/19 1443  DME Walker rolling  Once     Question:  Patient needs a walker to treat with the following condition  Answer:  Primary localized osteoarthritis of right knee   06/23/19 1442   06/23/19 1443  DME 3 n 1  Once     06/23/19 1442          Diagnostic Studies: No results found.  Disposition: Discharge disposition: 01-Home or Self Care         Follow-up Information    Earlie Server, MD. Schedule an appointment as soon as possible for a visit in 2 weeks.   Specialty: Orthopedic Surgery Contact information: [place] [address] [phone]            Signed: Linda Hedges 06/24/2019, 8:30 AM

## 2019-06-24 NOTE — Plan of Care (Signed)
Pt to d/c home with family when they arrive. No needs at this time. Pt did well with physical therapy.

## 2019-06-24 NOTE — TOC Initial Note (Signed)
Transition of Care [facility]) - Initial/Assessment Note    Patient Details  Name: Melvin Thomas MRN: [id number] Date of Birth: October 11, 1946  [facility] CM/SW Contact:    Joaquin Courts, RN Phone Number: 06/24/2019, 11:11 AM  Clinical Narrative:     CM spoke with patient at bedside. [facility] [place]. Patient reports he has rolling walker and 3-in-1.                Expected Discharge Plan: [place] Barriers to Discharge: No Barriers Identified   Patient Goals and CMS Choice Patient states their goals for this hospitalization and ongoing recovery are:: to go home CMS Medicare.gov Compare Post Acute Care list provided to:: Patient Choice offered to / list presented to : Patient  Expected Discharge Plan and Services Expected Discharge Plan: [place]   Discharge Planning Services: CM Consult Post Acute Care Choice: [place] arrangements for the past 2 months: Single Family Home Expected Discharge Date: 06/24/19               DME Arranged: N/A DME Agency: NA       HH Arranged: PT HH Agency: [facility] ([facility] [employer]) Date [place]: 06/24/19 Time [place]: 1111 Representative spoke with at [place]: pre arranged with MD office  Prior Living Arrangements/Services Living arrangements for the past 2 months: [place] with:: Self Patient language and need for interpreter reviewed:: Yes Do you feel safe going back to the place where you live?: Yes      Need for Family Participation in Patient Care: Yes (Comment) Care giver support system in place?: Yes (comment)   Criminal Activity/Legal Involvement Pertinent to Current Situation/Hospitalization: No - Comment as needed  Activities of Daily Living Home Assistive Devices/Equipment: Bedside commode/3-in-1, Cane (specify quad or straight), Eyeglasses ADL Screening (condition at  time of admission) Patient's cognitive ability adequate to safely complete daily activities?: Yes Is the patient deaf or have difficulty hearing?: Yes Does the patient have difficulty seeing, even when wearing glasses/contacts?: No Does the patient have difficulty concentrating, remembering, or making decisions?: No Patient able to express need for assistance with ADLs?: No Does the patient have difficulty dressing or bathing?: No Independently performs ADLs?: Yes (appropriate for developmental age) Does the patient have difficulty walking or climbing stairs?: Yes Weakness of Legs: None Weakness of Arms/Hands: None  Permission Sought/Granted                  Emotional Assessment Appearance:: Appears stated age Attitude/Demeanor/Rapport: Engaged Affect (typically observed): Accepting Orientation: : Oriented to Place, Oriented to  Time, Oriented to Situation, Oriented to Self   Psych Involvement: No (comment)  Admission diagnosis:  OA RIGHT KNEE Patient Active Problem List   Diagnosis Date Noted  . Primary localized osteoarthritis of right knee 06/23/2019   PCP:  Monico Blitz, MD Pharmacy:   [id number] [address], [place] [address] Phone: [phone] Fax: [phone]     Social Determinants of Health (SDOH) Interventions    Readmission Risk Interventions No flowsheet data found.

## 2019-06-24 NOTE — Progress Notes (Signed)
Physical Therapy Treatment Patient Details Name: Melvin EllisRalph Edward Thomas MRN: [id number] DOB: July 14, 1946 Today's Date: 06/24/2019    History of Present Illness 73 yo male s/p R TKR on 06/23/19. PMH includes HTN, R knee arthroscopy.    PT Comments    Pt is progressing well towards goals. Today's skilled session focused on gait and stair training, and providing pt with HEP. Pt requires min guard for OOB activities. Expected to d/c home today.   Follow Up Recommendations  Follow surgeon's recommendation for DC plan and follow-up therapies;Supervision for mobility/OOB(HHPT)     Equipment Recommendations  None recommended by PT    Recommendations for Other Services       Precautions / Restrictions Precautions Precautions: Fall Required Braces or Orthoses: Knee Immobilizer - Right Knee Immobilizer - Right: On when out of bed or walking Restrictions Weight Bearing Restrictions: No Other Position/Activity Restrictions: WBAT    Mobility  Bed Mobility Overal bed mobility: Needs Assistance Bed Mobility: Supine to Sit     Supine to sit: Min guard;HOB elevated     General bed mobility comments: min guard for safety. No physical assist needed.  Transfers Overall transfer level: Modified independent                  Ambulation/Gait Ambulation/Gait assistance: Min guard Gait Distance (Feet): 150 Feet Assistive device: Rolling walker (2 wheeled) Gait Pattern/deviations: Decreased step length - right;Decreased step length - left;Antalgic;Trunk flexed;Step-through pattern Gait velocity: decr   General Gait Details: min guard for safety. Noted some initial unsteadiness that resolved quickly. Mildly antalgic. VC for postrual control and forward gaze.   Stairs Stairs: Yes Stairs assistance: Min guard Stair Management: Two rails;Step to pattern;Forwards Number of Stairs: 3 General stair comments: Pt negotiated 3 short steps to simulate home enviroment. VC for sequencing and  technique. Min guard for safety.   Wheelchair Mobility    Modified Rankin (Stroke Patients Only)       Balance Overall balance assessment: Mild deficits observed, not formally tested                                          Cognition Arousal/Alertness: Awake/alert Behavior During Therapy: WFL for tasks assessed/performed Overall Cognitive Status: Within Functional Limits for tasks assessed                                        Exercises Total Joint Exercises Quad Sets: AROM;Right;5 reps;Supine Towel Squeeze: AROM;Right;5 reps;Supine Short Arc Quad: AROM;Right;5 reps;Supine Heel Slides: AROM;Right;Supine;Seated;10 reps Hip ABduction/ADduction: AROM;Right;5 reps;Supine Straight Leg Raises: AROM;Right;5 reps;Supine Long Arc Quad: AROM;Right;5 reps;Seated Knee Flexion: AROM;Right;5 reps;Seated(10 sec holds)    General Comments        Pertinent Vitals/Pain Pain Assessment: No/denies pain    Home Living Family/patient expects to be discharged to:: Private residence Living Arrangements: Alone Available Help at Discharge: Family;Available PRN/intermittently Type of Home: House Home Access: Stairs to enter   Home Layout: One level Home Equipment: Environmental consultantWalker - 2 wheels;Cane - single point;Bedside commode      Prior Function Level of Independence: Independent with assistive device(s)      Comments: pt reports using cane for ambulation as needed PTA   PT Goals (current goals can now be found in the care plan section) Acute Rehab PT Goals Patient Stated  Goal: home today. PT Goal Formulation: With patient Time For Goal Achievement: 06/30/19 Potential to Achieve Goals: Good Progress towards PT goals: Progressing toward goals    Frequency    7X/week      PT Plan Current plan remains appropriate    Co-evaluation              AM-PAC PT "6 Clicks" Mobility   Outcome Measure  Help needed turning from your back to your side  while in a flat bed without using bedrails?: A Little Help needed moving from lying on your back to sitting on the side of a flat bed without using bedrails?: A Little Help needed moving to and from a bed to a chair (including a wheelchair)?: A Little Help needed standing up from a chair using your arms (e.g., wheelchair or bedside chair)?: A Little Help needed to walk in hospital room?: A Little Help needed climbing 3-5 steps with a railing? : A Little 6 Click Score: 18    End of Session Equipment Utilized During Treatment: Gait belt Activity Tolerance: Patient tolerated treatment well Patient left: in chair;with call bell/phone within reach(SCD on RLE not applied due to bleeding from dressing, reinforced by RN) Nurse Communication: Mobility status PT Visit Diagnosis: Difficulty in walking, not elsewhere classified (R26.2);Other abnormalities of gait and mobility (R26.89)     Time: 1761-6073 PT Time Calculation (min) (ACUTE ONLY): 41 min  Charges:  $Gait Training: 23-37 mins $Therapeutic Exercise: 8-22 mins                    Melvin Thomas, [place] Pager [phone] Acute Rehab    Melvin Thomas 06/24/2019, 12:39 PM

## 2019-06-24 NOTE — Progress Notes (Signed)
    Home health agencies that serve [id number].        Home Health Agencies Search Results  Results List Table  Home Health Agency Information Quality of Patient Care Rating Patient Survey Summary Rating  [facility] [phone] 4 out of 5 stars 4 out of 5 stars  [facility] [phone] 4  out of 5 stars 3 out of 5 stars  [facility] [phone] 4 out of 5 stars 4 out of 5 stars  [facility] [phone] 4 out of 5 stars 4 out of 5 stars  [facility] [phone] 3  out of 5 stars 4 out of 5 stars  [facility] [phone] 3 out of 5 stars 4 out of 5 stars  [facility] [phone] 3  out of 5 stars 4 out of 5 stars  [facility] [phone] 4  out of 5 stars 3 out of 5 stars   Home Health Footnotes  Footnote number Footnote as displayed on Home Health Compare  1 This agency provides services under a federal waiver program to non-traditional, chronic long term population.  2 This agency provides services to a special needs population.  3 Not Available.  4 The number of patient episodes for this measure is too small to report.  5 This measure currently does not have data or provider has been certified/recertified for less than 6 months.  6 The national average for this measure is not provided because of state-to-state differences in data collection.  7 Medicare is not displaying rates for this measure for any home health agency, because of an issue with the data.  8 There were problems with the data and they are being corrected.  9 Zero, or very few, patients met the survey's rules for inclusion. The scores shown, if any, reflect a very small number of surveys and may not accurately tell how an agency is doing.  10 Survey results are based on less than 12 months of data.  11 Fewer than 70 patients completed the survey. Use the scores shown, if any,  with caution as the number of surveys may be too low to accurately tell how an agency is doing.  12 No survey results are available for this period.  13 Data suppressed by CMS for one or more quarters.    

## 2019-06-24 NOTE — Care Management Obs Status (Signed)
[facility] NOTIFICATION   Patient Details  Name: Melvin Thomas MRN: [id number] Date of Birth: 16-Jul-1946   Medicare Observation Status Notification Given:  Yes    Joaquin Courts, RN 06/24/2019, 12:40 PM

## 2019-06-24 NOTE — Progress Notes (Signed)
PA called to reroute pt prescriptions of pt pharmacy in [place]. Pt did not even know where other pharmacy was located and had never gotten scripts there.

## 2019-06-26 ENCOUNTER — Encounter (HOSPITAL_COMMUNITY): Payer: Self-pay | Admitting: Orthopedic Surgery

## 2019-06-30 ENCOUNTER — Other Ambulatory Visit (HOSPITAL_COMMUNITY): Payer: Self-pay | Admitting: Orthopedic Surgery

## 2019-06-30 ENCOUNTER — Other Ambulatory Visit: Payer: Self-pay | Admitting: Orthopedic Surgery

## 2019-06-30 ENCOUNTER — Other Ambulatory Visit: Payer: Self-pay

## 2019-06-30 ENCOUNTER — Ambulatory Visit (HOSPITAL_COMMUNITY)
Admission: RE | Admit: 2019-06-30 | Discharge: 2019-06-30 | Disposition: A | Discharge status: Home / Self Care | Payer: Medicare Other | Source: Ambulatory Visit | Attending: Orthopedic Surgery | Admitting: Orthopedic Surgery

## 2019-06-30 DIAGNOSIS — M7989 Other specified soft tissue disorders: Secondary | ICD-10-CM | POA: Diagnosis not present

## 2019-11-11 DIAGNOSIS — D61818 Other pancytopenia: Secondary | ICD-10-CM | POA: Diagnosis not present

## 2019-11-11 DIAGNOSIS — Z87891 Personal history of nicotine dependence: Secondary | ICD-10-CM | POA: Diagnosis not present

## 2019-11-11 DIAGNOSIS — Z743 Need for continuous supervision: Secondary | ICD-10-CM | POA: Diagnosis not present

## 2019-11-11 DIAGNOSIS — R110 Nausea: Secondary | ICD-10-CM | POA: Diagnosis not present

## 2019-11-11 DIAGNOSIS — R531 Weakness: Secondary | ICD-10-CM | POA: Diagnosis not present

## 2019-11-11 DIAGNOSIS — I10 Essential (primary) hypertension: Secondary | ICD-10-CM | POA: Diagnosis not present

## 2019-11-11 DIAGNOSIS — R109 Unspecified abdominal pain: Secondary | ICD-10-CM | POA: Diagnosis not present

## 2019-11-11 DIAGNOSIS — Z79899 Other long term (current) drug therapy: Secondary | ICD-10-CM | POA: Diagnosis not present

## 2019-11-11 DIAGNOSIS — R197 Diarrhea, unspecified: Secondary | ICD-10-CM | POA: Diagnosis not present

## 2019-11-11 DIAGNOSIS — E876 Hypokalemia: Secondary | ICD-10-CM | POA: Diagnosis not present

## 2019-11-11 DIAGNOSIS — R42 Dizziness and giddiness: Secondary | ICD-10-CM | POA: Diagnosis not present

## 2019-11-13 DIAGNOSIS — I10 Essential (primary) hypertension: Secondary | ICD-10-CM | POA: Diagnosis not present

## 2019-11-13 DIAGNOSIS — Z299 Encounter for prophylactic measures, unspecified: Secondary | ICD-10-CM | POA: Diagnosis not present

## 2019-11-13 DIAGNOSIS — J449 Chronic obstructive pulmonary disease, unspecified: Secondary | ICD-10-CM | POA: Diagnosis not present

## 2019-11-13 DIAGNOSIS — Z6823 Body mass index (BMI) 23.0-23.9, adult: Secondary | ICD-10-CM | POA: Diagnosis not present

## 2019-11-13 DIAGNOSIS — Z87891 Personal history of nicotine dependence: Secondary | ICD-10-CM | POA: Diagnosis not present

## 2019-11-15 DIAGNOSIS — I10 Essential (primary) hypertension: Secondary | ICD-10-CM | POA: Diagnosis not present

## 2019-11-15 DIAGNOSIS — R2681 Unsteadiness on feet: Secondary | ICD-10-CM | POA: Diagnosis not present

## 2019-11-15 DIAGNOSIS — Z6823 Body mass index (BMI) 23.0-23.9, adult: Secondary | ICD-10-CM | POA: Diagnosis not present

## 2019-11-15 DIAGNOSIS — Z299 Encounter for prophylactic measures, unspecified: Secondary | ICD-10-CM | POA: Diagnosis not present

## 2019-11-21 DIAGNOSIS — Z9181 History of falling: Secondary | ICD-10-CM | POA: Diagnosis not present

## 2019-11-21 DIAGNOSIS — R296 Repeated falls: Secondary | ICD-10-CM | POA: Diagnosis not present

## 2019-11-21 DIAGNOSIS — I6522 Occlusion and stenosis of left carotid artery: Secondary | ICD-10-CM | POA: Diagnosis not present

## 2019-11-21 DIAGNOSIS — I10 Essential (primary) hypertension: Secondary | ICD-10-CM | POA: Diagnosis not present

## 2019-11-21 DIAGNOSIS — J449 Chronic obstructive pulmonary disease, unspecified: Secondary | ICD-10-CM | POA: Diagnosis not present

## 2019-11-21 DIAGNOSIS — R42 Dizziness and giddiness: Secondary | ICD-10-CM | POA: Diagnosis not present

## 2019-11-21 DIAGNOSIS — Z87891 Personal history of nicotine dependence: Secondary | ICD-10-CM | POA: Diagnosis not present

## 2019-12-07 DIAGNOSIS — I10 Essential (primary) hypertension: Secondary | ICD-10-CM | POA: Diagnosis not present

## 2019-12-07 DIAGNOSIS — R42 Dizziness and giddiness: Secondary | ICD-10-CM | POA: Diagnosis not present

## 2019-12-07 DIAGNOSIS — J449 Chronic obstructive pulmonary disease, unspecified: Secondary | ICD-10-CM | POA: Diagnosis not present

## 2019-12-07 DIAGNOSIS — R296 Repeated falls: Secondary | ICD-10-CM | POA: Diagnosis not present

## 2019-12-18 DIAGNOSIS — Z299 Encounter for prophylactic measures, unspecified: Secondary | ICD-10-CM | POA: Diagnosis not present

## 2019-12-18 DIAGNOSIS — Z713 Dietary counseling and surveillance: Secondary | ICD-10-CM | POA: Diagnosis not present

## 2019-12-18 DIAGNOSIS — I10 Essential (primary) hypertension: Secondary | ICD-10-CM | POA: Diagnosis not present

## 2019-12-18 DIAGNOSIS — Z6823 Body mass index (BMI) 23.0-23.9, adult: Secondary | ICD-10-CM | POA: Diagnosis not present

## 2019-12-18 DIAGNOSIS — I779 Disorder of arteries and arterioles, unspecified: Secondary | ICD-10-CM | POA: Diagnosis not present

## 2020-01-12 DIAGNOSIS — S41112A Laceration without foreign body of left upper arm, initial encounter: Secondary | ICD-10-CM | POA: Diagnosis not present

## 2020-01-12 DIAGNOSIS — M25462 Effusion, left knee: Secondary | ICD-10-CM | POA: Diagnosis not present

## 2020-01-12 DIAGNOSIS — Z886 Allergy status to analgesic agent status: Secondary | ICD-10-CM | POA: Diagnosis not present

## 2020-01-12 DIAGNOSIS — S80212A Abrasion, left knee, initial encounter: Secondary | ICD-10-CM | POA: Diagnosis not present

## 2020-01-12 DIAGNOSIS — Z885 Allergy status to narcotic agent status: Secondary | ICD-10-CM | POA: Diagnosis not present

## 2020-01-12 DIAGNOSIS — S20219A Contusion of unspecified front wall of thorax, initial encounter: Secondary | ICD-10-CM | POA: Diagnosis not present

## 2020-01-12 DIAGNOSIS — Z743 Need for continuous supervision: Secondary | ICD-10-CM | POA: Diagnosis not present

## 2020-01-12 DIAGNOSIS — W19XXXA Unspecified fall, initial encounter: Secondary | ICD-10-CM | POA: Diagnosis not present

## 2020-01-12 DIAGNOSIS — M25562 Pain in left knee: Secondary | ICD-10-CM | POA: Diagnosis not present

## 2020-01-12 DIAGNOSIS — W010XXA Fall on same level from slipping, tripping and stumbling without subsequent striking against object, initial encounter: Secondary | ICD-10-CM | POA: Diagnosis not present

## 2020-01-12 DIAGNOSIS — S8002XA Contusion of left knee, initial encounter: Secondary | ICD-10-CM | POA: Diagnosis not present

## 2020-01-12 DIAGNOSIS — I10 Essential (primary) hypertension: Secondary | ICD-10-CM | POA: Diagnosis not present

## 2020-01-12 DIAGNOSIS — R42 Dizziness and giddiness: Secondary | ICD-10-CM | POA: Diagnosis not present

## 2020-01-12 DIAGNOSIS — Z882 Allergy status to sulfonamides status: Secondary | ICD-10-CM | POA: Diagnosis not present

## 2020-02-16 DIAGNOSIS — H2512 Age-related nuclear cataract, left eye: Secondary | ICD-10-CM | POA: Diagnosis not present

## 2020-02-16 DIAGNOSIS — H2511 Age-related nuclear cataract, right eye: Secondary | ICD-10-CM | POA: Diagnosis not present

## 2020-02-16 DIAGNOSIS — Z01818 Encounter for other preprocedural examination: Secondary | ICD-10-CM | POA: Diagnosis not present

## 2020-02-16 DIAGNOSIS — H25812 Combined forms of age-related cataract, left eye: Secondary | ICD-10-CM | POA: Diagnosis not present

## 2020-02-22 DIAGNOSIS — I10 Essential (primary) hypertension: Secondary | ICD-10-CM | POA: Diagnosis not present

## 2020-02-22 DIAGNOSIS — M25562 Pain in left knee: Secondary | ICD-10-CM | POA: Diagnosis not present

## 2020-02-22 DIAGNOSIS — Z299 Encounter for prophylactic measures, unspecified: Secondary | ICD-10-CM | POA: Diagnosis not present

## 2020-03-01 DIAGNOSIS — H25811 Combined forms of age-related cataract, right eye: Secondary | ICD-10-CM | POA: Diagnosis not present

## 2020-03-01 DIAGNOSIS — H2511 Age-related nuclear cataract, right eye: Secondary | ICD-10-CM | POA: Diagnosis not present

## 2020-03-25 DIAGNOSIS — I10 Essential (primary) hypertension: Secondary | ICD-10-CM | POA: Diagnosis not present

## 2020-03-25 DIAGNOSIS — G4700 Insomnia, unspecified: Secondary | ICD-10-CM | POA: Diagnosis not present

## 2020-07-19 DIAGNOSIS — Z299 Encounter for prophylactic measures, unspecified: Secondary | ICD-10-CM | POA: Diagnosis not present

## 2020-07-19 DIAGNOSIS — R5383 Other fatigue: Secondary | ICD-10-CM | POA: Diagnosis not present

## 2020-07-19 DIAGNOSIS — Z20822 Contact with and (suspected) exposure to covid-19: Secondary | ICD-10-CM | POA: Diagnosis not present

## 2020-07-19 DIAGNOSIS — J449 Chronic obstructive pulmonary disease, unspecified: Secondary | ICD-10-CM | POA: Diagnosis not present

## 2020-07-19 DIAGNOSIS — Z6823 Body mass index (BMI) 23.0-23.9, adult: Secondary | ICD-10-CM | POA: Diagnosis not present

## 2020-09-18 DIAGNOSIS — S20212A Contusion of left front wall of thorax, initial encounter: Secondary | ICD-10-CM | POA: Diagnosis not present

## 2020-09-18 DIAGNOSIS — Z720 Tobacco use: Secondary | ICD-10-CM | POA: Diagnosis not present

## 2020-09-18 DIAGNOSIS — Z743 Need for continuous supervision: Secondary | ICD-10-CM | POA: Diagnosis not present

## 2020-09-18 DIAGNOSIS — F17210 Nicotine dependence, cigarettes, uncomplicated: Secondary | ICD-10-CM | POA: Diagnosis not present

## 2020-09-18 DIAGNOSIS — R52 Pain, unspecified: Secondary | ICD-10-CM | POA: Diagnosis not present

## 2020-09-18 DIAGNOSIS — F32A Depression, unspecified: Secondary | ICD-10-CM | POA: Diagnosis not present

## 2020-09-18 DIAGNOSIS — Z9181 History of falling: Secondary | ICD-10-CM | POA: Diagnosis not present

## 2020-09-18 DIAGNOSIS — S2243XA Multiple fractures of ribs, bilateral, initial encounter for closed fracture: Secondary | ICD-10-CM | POA: Diagnosis not present

## 2020-09-18 DIAGNOSIS — Z602 Problems related to living alone: Secondary | ICD-10-CM | POA: Diagnosis not present

## 2020-09-18 DIAGNOSIS — I82401 Acute embolism and thrombosis of unspecified deep veins of right lower extremity: Secondary | ICD-10-CM | POA: Diagnosis not present

## 2020-09-18 DIAGNOSIS — D696 Thrombocytopenia, unspecified: Secondary | ICD-10-CM | POA: Diagnosis not present

## 2020-09-18 DIAGNOSIS — D6959 Other secondary thrombocytopenia: Secondary | ICD-10-CM | POA: Diagnosis not present

## 2020-09-18 DIAGNOSIS — R6889 Other general symptoms and signs: Secondary | ICD-10-CM | POA: Diagnosis not present

## 2020-09-18 DIAGNOSIS — Z96651 Presence of right artificial knee joint: Secondary | ICD-10-CM | POA: Diagnosis not present

## 2020-09-18 DIAGNOSIS — R269 Unspecified abnormalities of gait and mobility: Secondary | ICD-10-CM | POA: Diagnosis not present

## 2020-09-18 DIAGNOSIS — R2689 Other abnormalities of gait and mobility: Secondary | ICD-10-CM | POA: Diagnosis not present

## 2020-09-18 DIAGNOSIS — E538 Deficiency of other specified B group vitamins: Secondary | ICD-10-CM | POA: Diagnosis not present

## 2020-09-18 DIAGNOSIS — Z79899 Other long term (current) drug therapy: Secondary | ICD-10-CM | POA: Diagnosis not present

## 2020-09-18 DIAGNOSIS — S2249XA Multiple fractures of ribs, unspecified side, initial encounter for closed fracture: Secondary | ICD-10-CM | POA: Diagnosis not present

## 2020-09-18 DIAGNOSIS — W1830XA Fall on same level, unspecified, initial encounter: Secondary | ICD-10-CM | POA: Diagnosis not present

## 2020-09-18 DIAGNOSIS — I618 Other nontraumatic intracerebral hemorrhage: Secondary | ICD-10-CM | POA: Diagnosis not present

## 2020-09-18 DIAGNOSIS — I6782 Cerebral ischemia: Secondary | ICD-10-CM | POA: Diagnosis not present

## 2020-09-18 DIAGNOSIS — R2681 Unsteadiness on feet: Secondary | ICD-10-CM | POA: Diagnosis not present

## 2020-09-18 DIAGNOSIS — W19XXXA Unspecified fall, initial encounter: Secondary | ICD-10-CM | POA: Diagnosis not present

## 2020-09-18 DIAGNOSIS — R42 Dizziness and giddiness: Secondary | ICD-10-CM | POA: Diagnosis not present

## 2020-09-18 DIAGNOSIS — E875 Hyperkalemia: Secondary | ICD-10-CM | POA: Diagnosis not present

## 2020-09-19 DIAGNOSIS — I6782 Cerebral ischemia: Secondary | ICD-10-CM | POA: Diagnosis not present

## 2020-09-19 DIAGNOSIS — I618 Other nontraumatic intracerebral hemorrhage: Secondary | ICD-10-CM | POA: Diagnosis not present

## 2020-09-19 DIAGNOSIS — R269 Unspecified abnormalities of gait and mobility: Secondary | ICD-10-CM | POA: Diagnosis not present

## 2020-09-21 DIAGNOSIS — S2239XA Fracture of one rib, unspecified side, initial encounter for closed fracture: Secondary | ICD-10-CM | POA: Diagnosis not present

## 2020-09-21 DIAGNOSIS — S298XXA Other specified injuries of thorax, initial encounter: Secondary | ICD-10-CM | POA: Diagnosis not present

## 2020-10-18 DIAGNOSIS — H40033 Anatomical narrow angle, bilateral: Secondary | ICD-10-CM | POA: Diagnosis not present

## 2020-10-18 DIAGNOSIS — H2513 Age-related nuclear cataract, bilateral: Secondary | ICD-10-CM | POA: Diagnosis not present

## 2021-02-04 DIAGNOSIS — Z79899 Other long term (current) drug therapy: Secondary | ICD-10-CM | POA: Diagnosis not present

## 2021-02-04 DIAGNOSIS — Z743 Need for continuous supervision: Secondary | ICD-10-CM | POA: Diagnosis not present

## 2021-02-04 DIAGNOSIS — S51811A Laceration without foreign body of right forearm, initial encounter: Secondary | ICD-10-CM | POA: Diagnosis not present

## 2021-02-04 DIAGNOSIS — S59911A Unspecified injury of right forearm, initial encounter: Secondary | ICD-10-CM | POA: Diagnosis not present

## 2021-02-04 DIAGNOSIS — S3991XA Unspecified injury of abdomen, initial encounter: Secondary | ICD-10-CM | POA: Diagnosis not present

## 2021-02-04 DIAGNOSIS — S20211A Contusion of right front wall of thorax, initial encounter: Secondary | ICD-10-CM | POA: Diagnosis not present

## 2021-02-04 DIAGNOSIS — Z885 Allergy status to narcotic agent status: Secondary | ICD-10-CM | POA: Diagnosis not present

## 2021-02-04 DIAGNOSIS — K760 Fatty (change of) liver, not elsewhere classified: Secondary | ICD-10-CM | POA: Diagnosis not present

## 2021-02-04 DIAGNOSIS — S2243XA Multiple fractures of ribs, bilateral, initial encounter for closed fracture: Secondary | ICD-10-CM | POA: Diagnosis not present

## 2021-02-04 DIAGNOSIS — I700 Atherosclerosis of aorta: Secondary | ICD-10-CM | POA: Diagnosis not present

## 2021-02-04 DIAGNOSIS — M533 Sacrococcygeal disorders, not elsewhere classified: Secondary | ICD-10-CM | POA: Diagnosis not present

## 2021-02-04 DIAGNOSIS — R58 Hemorrhage, not elsewhere classified: Secondary | ICD-10-CM | POA: Diagnosis not present

## 2021-02-04 DIAGNOSIS — I2510 Atherosclerotic heart disease of native coronary artery without angina pectoris: Secondary | ICD-10-CM | POA: Diagnosis not present

## 2021-02-04 DIAGNOSIS — S301XXA Contusion of abdominal wall, initial encounter: Secondary | ICD-10-CM | POA: Diagnosis not present

## 2021-02-04 DIAGNOSIS — S3993XA Unspecified injury of pelvis, initial encounter: Secondary | ICD-10-CM | POA: Diagnosis not present

## 2021-02-04 DIAGNOSIS — K8020 Calculus of gallbladder without cholecystitis without obstruction: Secondary | ICD-10-CM | POA: Diagnosis not present

## 2021-02-04 DIAGNOSIS — W19XXXA Unspecified fall, initial encounter: Secondary | ICD-10-CM | POA: Diagnosis not present

## 2021-02-04 DIAGNOSIS — E278 Other specified disorders of adrenal gland: Secondary | ICD-10-CM | POA: Diagnosis not present

## 2021-02-04 DIAGNOSIS — K6389 Other specified diseases of intestine: Secondary | ICD-10-CM | POA: Diagnosis not present

## 2021-02-04 DIAGNOSIS — S0990XA Unspecified injury of head, initial encounter: Secondary | ICD-10-CM | POA: Diagnosis not present

## 2021-02-04 DIAGNOSIS — I6782 Cerebral ischemia: Secondary | ICD-10-CM | POA: Diagnosis not present

## 2021-03-20 DIAGNOSIS — I7090 Unspecified atherosclerosis: Secondary | ICD-10-CM | POA: Diagnosis not present

## 2021-03-20 DIAGNOSIS — G319 Degenerative disease of nervous system, unspecified: Secondary | ICD-10-CM | POA: Diagnosis not present

## 2021-03-20 DIAGNOSIS — J320 Chronic maxillary sinusitis: Secondary | ICD-10-CM | POA: Diagnosis not present

## 2021-03-20 DIAGNOSIS — Z743 Need for continuous supervision: Secondary | ICD-10-CM | POA: Diagnosis not present

## 2021-03-20 DIAGNOSIS — F17200 Nicotine dependence, unspecified, uncomplicated: Secondary | ICD-10-CM | POA: Diagnosis not present

## 2021-03-20 DIAGNOSIS — M47812 Spondylosis without myelopathy or radiculopathy, cervical region: Secondary | ICD-10-CM | POA: Diagnosis not present

## 2021-03-20 DIAGNOSIS — M5031 Other cervical disc degeneration,  high cervical region: Secondary | ICD-10-CM | POA: Diagnosis not present

## 2021-03-20 DIAGNOSIS — Z885 Allergy status to narcotic agent status: Secondary | ICD-10-CM | POA: Diagnosis not present

## 2021-03-20 DIAGNOSIS — W1839XA Other fall on same level, initial encounter: Secondary | ICD-10-CM | POA: Diagnosis not present

## 2021-03-20 DIAGNOSIS — S0003XA Contusion of scalp, initial encounter: Secondary | ICD-10-CM | POA: Diagnosis not present

## 2021-03-20 DIAGNOSIS — S199XXA Unspecified injury of neck, initial encounter: Secondary | ICD-10-CM | POA: Diagnosis not present

## 2021-03-20 DIAGNOSIS — I2510 Atherosclerotic heart disease of native coronary artery without angina pectoris: Secondary | ICD-10-CM | POA: Diagnosis not present

## 2021-03-20 DIAGNOSIS — M2578 Osteophyte, vertebrae: Secondary | ICD-10-CM | POA: Diagnosis not present

## 2021-03-20 DIAGNOSIS — W19XXXA Unspecified fall, initial encounter: Secondary | ICD-10-CM | POA: Diagnosis not present

## 2021-03-20 DIAGNOSIS — R58 Hemorrhage, not elsewhere classified: Secondary | ICD-10-CM | POA: Diagnosis not present

## 2021-03-20 DIAGNOSIS — I6529 Occlusion and stenosis of unspecified carotid artery: Secondary | ICD-10-CM | POA: Diagnosis not present

## 2021-03-20 DIAGNOSIS — I499 Cardiac arrhythmia, unspecified: Secondary | ICD-10-CM | POA: Diagnosis not present

## 2021-03-20 DIAGNOSIS — I700 Atherosclerosis of aorta: Secondary | ICD-10-CM | POA: Diagnosis not present

## 2021-03-25 DIAGNOSIS — Z6823 Body mass index (BMI) 23.0-23.9, adult: Secondary | ICD-10-CM | POA: Diagnosis not present

## 2021-03-25 DIAGNOSIS — J449 Chronic obstructive pulmonary disease, unspecified: Secondary | ICD-10-CM | POA: Diagnosis not present

## 2021-03-25 DIAGNOSIS — Z299 Encounter for prophylactic measures, unspecified: Secondary | ICD-10-CM | POA: Diagnosis not present

## 2021-03-25 DIAGNOSIS — F17210 Nicotine dependence, cigarettes, uncomplicated: Secondary | ICD-10-CM | POA: Diagnosis not present

## 2021-03-25 DIAGNOSIS — I10 Essential (primary) hypertension: Secondary | ICD-10-CM | POA: Diagnosis not present

## 2021-03-25 DIAGNOSIS — I779 Disorder of arteries and arterioles, unspecified: Secondary | ICD-10-CM | POA: Diagnosis not present

## 2021-03-29 DIAGNOSIS — Z720 Tobacco use: Secondary | ICD-10-CM | POA: Diagnosis not present

## 2021-03-29 DIAGNOSIS — Z20822 Contact with and (suspected) exposure to covid-19: Secondary | ICD-10-CM | POA: Diagnosis not present

## 2021-03-29 DIAGNOSIS — F17210 Nicotine dependence, cigarettes, uncomplicated: Secondary | ICD-10-CM | POA: Diagnosis not present

## 2021-03-29 DIAGNOSIS — D72819 Decreased white blood cell count, unspecified: Secondary | ICD-10-CM | POA: Diagnosis not present

## 2021-03-29 DIAGNOSIS — K5730 Diverticulosis of large intestine without perforation or abscess without bleeding: Secondary | ICD-10-CM | POA: Diagnosis not present

## 2021-03-29 DIAGNOSIS — M47816 Spondylosis without myelopathy or radiculopathy, lumbar region: Secondary | ICD-10-CM | POA: Diagnosis not present

## 2021-03-29 DIAGNOSIS — R9082 White matter disease, unspecified: Secondary | ICD-10-CM | POA: Diagnosis not present

## 2021-03-29 DIAGNOSIS — K5790 Diverticulosis of intestine, part unspecified, without perforation or abscess without bleeding: Secondary | ICD-10-CM | POA: Diagnosis not present

## 2021-03-29 DIAGNOSIS — N3289 Other specified disorders of bladder: Secondary | ICD-10-CM | POA: Diagnosis not present

## 2021-03-29 DIAGNOSIS — N3090 Cystitis, unspecified without hematuria: Secondary | ICD-10-CM | POA: Diagnosis not present

## 2021-03-29 DIAGNOSIS — R9431 Abnormal electrocardiogram [ECG] [EKG]: Secondary | ICD-10-CM | POA: Diagnosis not present

## 2021-03-29 DIAGNOSIS — Z862 Personal history of diseases of the blood and blood-forming organs and certain disorders involving the immune mechanism: Secondary | ICD-10-CM | POA: Diagnosis not present

## 2021-03-29 DIAGNOSIS — Z28310 Unvaccinated for covid-19: Secondary | ICD-10-CM | POA: Diagnosis not present

## 2021-03-29 DIAGNOSIS — R531 Weakness: Secondary | ICD-10-CM | POA: Diagnosis not present

## 2021-03-29 DIAGNOSIS — N3000 Acute cystitis without hematuria: Secondary | ICD-10-CM | POA: Diagnosis not present

## 2021-03-29 DIAGNOSIS — R296 Repeated falls: Secondary | ICD-10-CM | POA: Diagnosis not present

## 2021-03-29 DIAGNOSIS — I10 Essential (primary) hypertension: Secondary | ICD-10-CM | POA: Diagnosis not present

## 2021-03-29 DIAGNOSIS — X58XXXA Exposure to other specified factors, initial encounter: Secondary | ICD-10-CM | POA: Diagnosis not present

## 2021-03-29 DIAGNOSIS — R251 Tremor, unspecified: Secondary | ICD-10-CM | POA: Diagnosis not present

## 2021-03-29 DIAGNOSIS — Z79899 Other long term (current) drug therapy: Secondary | ICD-10-CM | POA: Diagnosis not present

## 2021-03-29 DIAGNOSIS — R42 Dizziness and giddiness: Secondary | ICD-10-CM | POA: Diagnosis not present

## 2021-03-29 DIAGNOSIS — S0003XA Contusion of scalp, initial encounter: Secondary | ICD-10-CM | POA: Diagnosis not present

## 2021-03-29 DIAGNOSIS — M1612 Unilateral primary osteoarthritis, left hip: Secondary | ICD-10-CM | POA: Diagnosis not present

## 2021-04-03 DIAGNOSIS — R531 Weakness: Secondary | ICD-10-CM | POA: Diagnosis not present

## 2021-04-03 DIAGNOSIS — M1712 Unilateral primary osteoarthritis, left knee: Secondary | ICD-10-CM | POA: Diagnosis not present

## 2021-04-03 DIAGNOSIS — D72819 Decreased white blood cell count, unspecified: Secondary | ICD-10-CM | POA: Diagnosis not present

## 2021-04-03 DIAGNOSIS — Z9181 History of falling: Secondary | ICD-10-CM | POA: Diagnosis not present

## 2021-04-03 DIAGNOSIS — K5730 Diverticulosis of large intestine without perforation or abscess without bleeding: Secondary | ICD-10-CM | POA: Diagnosis not present

## 2021-04-03 DIAGNOSIS — D696 Thrombocytopenia, unspecified: Secondary | ICD-10-CM | POA: Diagnosis not present

## 2021-04-03 DIAGNOSIS — F32A Depression, unspecified: Secondary | ICD-10-CM | POA: Diagnosis not present

## 2021-04-03 DIAGNOSIS — N3090 Cystitis, unspecified without hematuria: Secondary | ICD-10-CM | POA: Diagnosis not present

## 2021-04-03 DIAGNOSIS — F17210 Nicotine dependence, cigarettes, uncomplicated: Secondary | ICD-10-CM | POA: Diagnosis not present

## 2021-04-03 DIAGNOSIS — M1612 Unilateral primary osteoarthritis, left hip: Secondary | ICD-10-CM | POA: Diagnosis not present

## 2021-04-03 DIAGNOSIS — R278 Other lack of coordination: Secondary | ICD-10-CM | POA: Diagnosis not present

## 2021-04-03 DIAGNOSIS — M47816 Spondylosis without myelopathy or radiculopathy, lumbar region: Secondary | ICD-10-CM | POA: Diagnosis not present

## 2021-04-04 DIAGNOSIS — Z9181 History of falling: Secondary | ICD-10-CM | POA: Diagnosis not present

## 2021-04-04 DIAGNOSIS — M1712 Unilateral primary osteoarthritis, left knee: Secondary | ICD-10-CM | POA: Diagnosis not present

## 2021-04-04 DIAGNOSIS — M47816 Spondylosis without myelopathy or radiculopathy, lumbar region: Secondary | ICD-10-CM | POA: Diagnosis not present

## 2021-04-04 DIAGNOSIS — N3090 Cystitis, unspecified without hematuria: Secondary | ICD-10-CM | POA: Diagnosis not present

## 2021-04-04 DIAGNOSIS — F32A Depression, unspecified: Secondary | ICD-10-CM | POA: Diagnosis not present

## 2021-04-04 DIAGNOSIS — D696 Thrombocytopenia, unspecified: Secondary | ICD-10-CM | POA: Diagnosis not present

## 2021-04-04 DIAGNOSIS — R531 Weakness: Secondary | ICD-10-CM | POA: Diagnosis not present

## 2021-04-04 DIAGNOSIS — R278 Other lack of coordination: Secondary | ICD-10-CM | POA: Diagnosis not present

## 2021-04-04 DIAGNOSIS — K5730 Diverticulosis of large intestine without perforation or abscess without bleeding: Secondary | ICD-10-CM | POA: Diagnosis not present

## 2021-04-04 DIAGNOSIS — F17210 Nicotine dependence, cigarettes, uncomplicated: Secondary | ICD-10-CM | POA: Diagnosis not present

## 2021-04-04 DIAGNOSIS — D72819 Decreased white blood cell count, unspecified: Secondary | ICD-10-CM | POA: Diagnosis not present

## 2021-04-04 DIAGNOSIS — M1612 Unilateral primary osteoarthritis, left hip: Secondary | ICD-10-CM | POA: Diagnosis not present

## 2021-04-05 DIAGNOSIS — M1612 Unilateral primary osteoarthritis, left hip: Secondary | ICD-10-CM | POA: Diagnosis not present

## 2021-04-05 DIAGNOSIS — F17210 Nicotine dependence, cigarettes, uncomplicated: Secondary | ICD-10-CM | POA: Diagnosis not present

## 2021-04-05 DIAGNOSIS — R278 Other lack of coordination: Secondary | ICD-10-CM | POA: Diagnosis not present

## 2021-04-05 DIAGNOSIS — R531 Weakness: Secondary | ICD-10-CM | POA: Diagnosis not present

## 2021-04-05 DIAGNOSIS — K5730 Diverticulosis of large intestine without perforation or abscess without bleeding: Secondary | ICD-10-CM | POA: Diagnosis not present

## 2021-04-05 DIAGNOSIS — Z9181 History of falling: Secondary | ICD-10-CM | POA: Diagnosis not present

## 2021-04-05 DIAGNOSIS — M47816 Spondylosis without myelopathy or radiculopathy, lumbar region: Secondary | ICD-10-CM | POA: Diagnosis not present

## 2021-04-05 DIAGNOSIS — M1712 Unilateral primary osteoarthritis, left knee: Secondary | ICD-10-CM | POA: Diagnosis not present

## 2021-04-05 DIAGNOSIS — F32A Depression, unspecified: Secondary | ICD-10-CM | POA: Diagnosis not present

## 2021-04-05 DIAGNOSIS — D696 Thrombocytopenia, unspecified: Secondary | ICD-10-CM | POA: Diagnosis not present

## 2021-04-05 DIAGNOSIS — D72819 Decreased white blood cell count, unspecified: Secondary | ICD-10-CM | POA: Diagnosis not present

## 2021-04-05 DIAGNOSIS — N3090 Cystitis, unspecified without hematuria: Secondary | ICD-10-CM | POA: Diagnosis not present

## 2021-04-07 DIAGNOSIS — K5730 Diverticulosis of large intestine without perforation or abscess without bleeding: Secondary | ICD-10-CM | POA: Diagnosis not present

## 2021-04-07 DIAGNOSIS — R531 Weakness: Secondary | ICD-10-CM | POA: Diagnosis not present

## 2021-04-07 DIAGNOSIS — N3090 Cystitis, unspecified without hematuria: Secondary | ICD-10-CM | POA: Diagnosis not present

## 2021-04-07 DIAGNOSIS — F17210 Nicotine dependence, cigarettes, uncomplicated: Secondary | ICD-10-CM | POA: Diagnosis not present

## 2021-04-07 DIAGNOSIS — F32A Depression, unspecified: Secondary | ICD-10-CM | POA: Diagnosis not present

## 2021-04-07 DIAGNOSIS — M47816 Spondylosis without myelopathy or radiculopathy, lumbar region: Secondary | ICD-10-CM | POA: Diagnosis not present

## 2021-04-07 DIAGNOSIS — D72819 Decreased white blood cell count, unspecified: Secondary | ICD-10-CM | POA: Diagnosis not present

## 2021-04-07 DIAGNOSIS — D696 Thrombocytopenia, unspecified: Secondary | ICD-10-CM | POA: Diagnosis not present

## 2021-04-07 DIAGNOSIS — M1612 Unilateral primary osteoarthritis, left hip: Secondary | ICD-10-CM | POA: Diagnosis not present

## 2021-04-07 DIAGNOSIS — M1712 Unilateral primary osteoarthritis, left knee: Secondary | ICD-10-CM | POA: Diagnosis not present

## 2021-04-07 DIAGNOSIS — R278 Other lack of coordination: Secondary | ICD-10-CM | POA: Diagnosis not present

## 2021-04-07 DIAGNOSIS — Z9181 History of falling: Secondary | ICD-10-CM | POA: Diagnosis not present

## 2021-04-09 DIAGNOSIS — K5730 Diverticulosis of large intestine without perforation or abscess without bleeding: Secondary | ICD-10-CM | POA: Diagnosis not present

## 2021-04-09 DIAGNOSIS — D72819 Decreased white blood cell count, unspecified: Secondary | ICD-10-CM | POA: Diagnosis not present

## 2021-04-09 DIAGNOSIS — N3090 Cystitis, unspecified without hematuria: Secondary | ICD-10-CM | POA: Diagnosis not present

## 2021-04-09 DIAGNOSIS — F17210 Nicotine dependence, cigarettes, uncomplicated: Secondary | ICD-10-CM | POA: Diagnosis not present

## 2021-04-09 DIAGNOSIS — M1612 Unilateral primary osteoarthritis, left hip: Secondary | ICD-10-CM | POA: Diagnosis not present

## 2021-04-09 DIAGNOSIS — M1712 Unilateral primary osteoarthritis, left knee: Secondary | ICD-10-CM | POA: Diagnosis not present

## 2021-04-09 DIAGNOSIS — R531 Weakness: Secondary | ICD-10-CM | POA: Diagnosis not present

## 2021-04-09 DIAGNOSIS — M47816 Spondylosis without myelopathy or radiculopathy, lumbar region: Secondary | ICD-10-CM | POA: Diagnosis not present

## 2021-04-09 DIAGNOSIS — R278 Other lack of coordination: Secondary | ICD-10-CM | POA: Diagnosis not present

## 2021-04-09 DIAGNOSIS — Z9181 History of falling: Secondary | ICD-10-CM | POA: Diagnosis not present

## 2021-04-09 DIAGNOSIS — D696 Thrombocytopenia, unspecified: Secondary | ICD-10-CM | POA: Diagnosis not present

## 2021-04-09 DIAGNOSIS — F32A Depression, unspecified: Secondary | ICD-10-CM | POA: Diagnosis not present

## 2021-04-10 DIAGNOSIS — M1712 Unilateral primary osteoarthritis, left knee: Secondary | ICD-10-CM | POA: Diagnosis not present

## 2021-04-10 DIAGNOSIS — R278 Other lack of coordination: Secondary | ICD-10-CM | POA: Diagnosis not present

## 2021-04-10 DIAGNOSIS — R531 Weakness: Secondary | ICD-10-CM | POA: Diagnosis not present

## 2021-04-10 DIAGNOSIS — F32A Depression, unspecified: Secondary | ICD-10-CM | POA: Diagnosis not present

## 2021-04-10 DIAGNOSIS — D696 Thrombocytopenia, unspecified: Secondary | ICD-10-CM | POA: Diagnosis not present

## 2021-04-10 DIAGNOSIS — M47816 Spondylosis without myelopathy or radiculopathy, lumbar region: Secondary | ICD-10-CM | POA: Diagnosis not present

## 2021-04-10 DIAGNOSIS — F17210 Nicotine dependence, cigarettes, uncomplicated: Secondary | ICD-10-CM | POA: Diagnosis not present

## 2021-04-10 DIAGNOSIS — D72819 Decreased white blood cell count, unspecified: Secondary | ICD-10-CM | POA: Diagnosis not present

## 2021-04-10 DIAGNOSIS — M1612 Unilateral primary osteoarthritis, left hip: Secondary | ICD-10-CM | POA: Diagnosis not present

## 2021-04-10 DIAGNOSIS — Z9181 History of falling: Secondary | ICD-10-CM | POA: Diagnosis not present

## 2021-04-10 DIAGNOSIS — K5730 Diverticulosis of large intestine without perforation or abscess without bleeding: Secondary | ICD-10-CM | POA: Diagnosis not present

## 2021-04-10 DIAGNOSIS — N3090 Cystitis, unspecified without hematuria: Secondary | ICD-10-CM | POA: Diagnosis not present

## 2021-04-12 DIAGNOSIS — Z9181 History of falling: Secondary | ICD-10-CM | POA: Diagnosis not present

## 2021-04-12 DIAGNOSIS — R531 Weakness: Secondary | ICD-10-CM | POA: Diagnosis not present

## 2021-04-12 DIAGNOSIS — M1612 Unilateral primary osteoarthritis, left hip: Secondary | ICD-10-CM | POA: Diagnosis not present

## 2021-04-12 DIAGNOSIS — D72819 Decreased white blood cell count, unspecified: Secondary | ICD-10-CM | POA: Diagnosis not present

## 2021-04-12 DIAGNOSIS — R278 Other lack of coordination: Secondary | ICD-10-CM | POA: Diagnosis not present

## 2021-04-12 DIAGNOSIS — F17210 Nicotine dependence, cigarettes, uncomplicated: Secondary | ICD-10-CM | POA: Diagnosis not present

## 2021-04-12 DIAGNOSIS — F32A Depression, unspecified: Secondary | ICD-10-CM | POA: Diagnosis not present

## 2021-04-12 DIAGNOSIS — K5730 Diverticulosis of large intestine without perforation or abscess without bleeding: Secondary | ICD-10-CM | POA: Diagnosis not present

## 2021-04-12 DIAGNOSIS — M47816 Spondylosis without myelopathy or radiculopathy, lumbar region: Secondary | ICD-10-CM | POA: Diagnosis not present

## 2021-04-12 DIAGNOSIS — D696 Thrombocytopenia, unspecified: Secondary | ICD-10-CM | POA: Diagnosis not present

## 2021-04-12 DIAGNOSIS — M1712 Unilateral primary osteoarthritis, left knee: Secondary | ICD-10-CM | POA: Diagnosis not present

## 2021-04-12 DIAGNOSIS — N3090 Cystitis, unspecified without hematuria: Secondary | ICD-10-CM | POA: Diagnosis not present

## 2021-04-14 DIAGNOSIS — I779 Disorder of arteries and arterioles, unspecified: Secondary | ICD-10-CM | POA: Diagnosis not present

## 2021-04-14 DIAGNOSIS — I10 Essential (primary) hypertension: Secondary | ICD-10-CM | POA: Diagnosis not present

## 2021-04-14 DIAGNOSIS — Z299 Encounter for prophylactic measures, unspecified: Secondary | ICD-10-CM | POA: Diagnosis not present

## 2021-04-14 DIAGNOSIS — Z6824 Body mass index (BMI) 24.0-24.9, adult: Secondary | ICD-10-CM | POA: Diagnosis not present

## 2021-04-16 DIAGNOSIS — D696 Thrombocytopenia, unspecified: Secondary | ICD-10-CM | POA: Diagnosis not present

## 2021-04-16 DIAGNOSIS — M1612 Unilateral primary osteoarthritis, left hip: Secondary | ICD-10-CM | POA: Diagnosis not present

## 2021-04-16 DIAGNOSIS — R531 Weakness: Secondary | ICD-10-CM | POA: Diagnosis not present

## 2021-04-16 DIAGNOSIS — D72819 Decreased white blood cell count, unspecified: Secondary | ICD-10-CM | POA: Diagnosis not present

## 2021-04-16 DIAGNOSIS — F32A Depression, unspecified: Secondary | ICD-10-CM | POA: Diagnosis not present

## 2021-04-16 DIAGNOSIS — F17210 Nicotine dependence, cigarettes, uncomplicated: Secondary | ICD-10-CM | POA: Diagnosis not present

## 2021-04-16 DIAGNOSIS — N3090 Cystitis, unspecified without hematuria: Secondary | ICD-10-CM | POA: Diagnosis not present

## 2021-04-16 DIAGNOSIS — M47816 Spondylosis without myelopathy or radiculopathy, lumbar region: Secondary | ICD-10-CM | POA: Diagnosis not present

## 2021-04-16 DIAGNOSIS — K5730 Diverticulosis of large intestine without perforation or abscess without bleeding: Secondary | ICD-10-CM | POA: Diagnosis not present

## 2021-04-16 DIAGNOSIS — M1712 Unilateral primary osteoarthritis, left knee: Secondary | ICD-10-CM | POA: Diagnosis not present

## 2021-04-16 DIAGNOSIS — Z9181 History of falling: Secondary | ICD-10-CM | POA: Diagnosis not present

## 2021-04-16 DIAGNOSIS — R278 Other lack of coordination: Secondary | ICD-10-CM | POA: Diagnosis not present

## 2021-04-17 DIAGNOSIS — M1712 Unilateral primary osteoarthritis, left knee: Secondary | ICD-10-CM | POA: Diagnosis not present

## 2021-04-17 DIAGNOSIS — D72819 Decreased white blood cell count, unspecified: Secondary | ICD-10-CM | POA: Diagnosis not present

## 2021-04-17 DIAGNOSIS — F17210 Nicotine dependence, cigarettes, uncomplicated: Secondary | ICD-10-CM | POA: Diagnosis not present

## 2021-04-17 DIAGNOSIS — F32A Depression, unspecified: Secondary | ICD-10-CM | POA: Diagnosis not present

## 2021-04-17 DIAGNOSIS — D696 Thrombocytopenia, unspecified: Secondary | ICD-10-CM | POA: Diagnosis not present

## 2021-04-17 DIAGNOSIS — N3090 Cystitis, unspecified without hematuria: Secondary | ICD-10-CM | POA: Diagnosis not present

## 2021-04-17 DIAGNOSIS — Z9181 History of falling: Secondary | ICD-10-CM | POA: Diagnosis not present

## 2021-04-17 DIAGNOSIS — R531 Weakness: Secondary | ICD-10-CM | POA: Diagnosis not present

## 2021-04-17 DIAGNOSIS — M1612 Unilateral primary osteoarthritis, left hip: Secondary | ICD-10-CM | POA: Diagnosis not present

## 2021-04-17 DIAGNOSIS — M47816 Spondylosis without myelopathy or radiculopathy, lumbar region: Secondary | ICD-10-CM | POA: Diagnosis not present

## 2021-04-17 DIAGNOSIS — R278 Other lack of coordination: Secondary | ICD-10-CM | POA: Diagnosis not present

## 2021-04-17 DIAGNOSIS — K5730 Diverticulosis of large intestine without perforation or abscess without bleeding: Secondary | ICD-10-CM | POA: Diagnosis not present

## 2021-04-22 DIAGNOSIS — R531 Weakness: Secondary | ICD-10-CM | POA: Diagnosis not present

## 2021-04-22 DIAGNOSIS — M1712 Unilateral primary osteoarthritis, left knee: Secondary | ICD-10-CM | POA: Diagnosis not present

## 2021-04-22 DIAGNOSIS — F17210 Nicotine dependence, cigarettes, uncomplicated: Secondary | ICD-10-CM | POA: Diagnosis not present

## 2021-04-22 DIAGNOSIS — F32A Depression, unspecified: Secondary | ICD-10-CM | POA: Diagnosis not present

## 2021-04-22 DIAGNOSIS — K5730 Diverticulosis of large intestine without perforation or abscess without bleeding: Secondary | ICD-10-CM | POA: Diagnosis not present

## 2021-04-22 DIAGNOSIS — M47816 Spondylosis without myelopathy or radiculopathy, lumbar region: Secondary | ICD-10-CM | POA: Diagnosis not present

## 2021-04-22 DIAGNOSIS — D696 Thrombocytopenia, unspecified: Secondary | ICD-10-CM | POA: Diagnosis not present

## 2021-04-22 DIAGNOSIS — Z9181 History of falling: Secondary | ICD-10-CM | POA: Diagnosis not present

## 2021-04-22 DIAGNOSIS — N3090 Cystitis, unspecified without hematuria: Secondary | ICD-10-CM | POA: Diagnosis not present

## 2021-04-22 DIAGNOSIS — D72819 Decreased white blood cell count, unspecified: Secondary | ICD-10-CM | POA: Diagnosis not present

## 2021-04-22 DIAGNOSIS — R278 Other lack of coordination: Secondary | ICD-10-CM | POA: Diagnosis not present

## 2021-04-22 DIAGNOSIS — M1612 Unilateral primary osteoarthritis, left hip: Secondary | ICD-10-CM | POA: Diagnosis not present

## 2021-04-23 DIAGNOSIS — R278 Other lack of coordination: Secondary | ICD-10-CM | POA: Diagnosis not present

## 2021-04-23 DIAGNOSIS — F17210 Nicotine dependence, cigarettes, uncomplicated: Secondary | ICD-10-CM | POA: Diagnosis not present

## 2021-04-23 DIAGNOSIS — F32A Depression, unspecified: Secondary | ICD-10-CM | POA: Diagnosis not present

## 2021-04-23 DIAGNOSIS — M1712 Unilateral primary osteoarthritis, left knee: Secondary | ICD-10-CM | POA: Diagnosis not present

## 2021-04-23 DIAGNOSIS — M47816 Spondylosis without myelopathy or radiculopathy, lumbar region: Secondary | ICD-10-CM | POA: Diagnosis not present

## 2021-04-23 DIAGNOSIS — N3090 Cystitis, unspecified without hematuria: Secondary | ICD-10-CM | POA: Diagnosis not present

## 2021-04-23 DIAGNOSIS — D72819 Decreased white blood cell count, unspecified: Secondary | ICD-10-CM | POA: Diagnosis not present

## 2021-04-23 DIAGNOSIS — Z9181 History of falling: Secondary | ICD-10-CM | POA: Diagnosis not present

## 2021-04-23 DIAGNOSIS — R531 Weakness: Secondary | ICD-10-CM | POA: Diagnosis not present

## 2021-04-23 DIAGNOSIS — K5730 Diverticulosis of large intestine without perforation or abscess without bleeding: Secondary | ICD-10-CM | POA: Diagnosis not present

## 2021-04-23 DIAGNOSIS — D696 Thrombocytopenia, unspecified: Secondary | ICD-10-CM | POA: Diagnosis not present

## 2021-04-23 DIAGNOSIS — M1612 Unilateral primary osteoarthritis, left hip: Secondary | ICD-10-CM | POA: Diagnosis not present

## 2021-04-24 DIAGNOSIS — F17210 Nicotine dependence, cigarettes, uncomplicated: Secondary | ICD-10-CM | POA: Diagnosis not present

## 2021-04-24 DIAGNOSIS — Z9181 History of falling: Secondary | ICD-10-CM | POA: Diagnosis not present

## 2021-04-24 DIAGNOSIS — K5730 Diverticulosis of large intestine without perforation or abscess without bleeding: Secondary | ICD-10-CM | POA: Diagnosis not present

## 2021-04-24 DIAGNOSIS — R531 Weakness: Secondary | ICD-10-CM | POA: Diagnosis not present

## 2021-04-24 DIAGNOSIS — N3090 Cystitis, unspecified without hematuria: Secondary | ICD-10-CM | POA: Diagnosis not present

## 2021-04-24 DIAGNOSIS — M1612 Unilateral primary osteoarthritis, left hip: Secondary | ICD-10-CM | POA: Diagnosis not present

## 2021-04-24 DIAGNOSIS — R278 Other lack of coordination: Secondary | ICD-10-CM | POA: Diagnosis not present

## 2021-04-24 DIAGNOSIS — D72819 Decreased white blood cell count, unspecified: Secondary | ICD-10-CM | POA: Diagnosis not present

## 2021-04-24 DIAGNOSIS — F32A Depression, unspecified: Secondary | ICD-10-CM | POA: Diagnosis not present

## 2021-04-24 DIAGNOSIS — M47816 Spondylosis without myelopathy or radiculopathy, lumbar region: Secondary | ICD-10-CM | POA: Diagnosis not present

## 2021-04-24 DIAGNOSIS — M1712 Unilateral primary osteoarthritis, left knee: Secondary | ICD-10-CM | POA: Diagnosis not present

## 2021-04-24 DIAGNOSIS — D696 Thrombocytopenia, unspecified: Secondary | ICD-10-CM | POA: Diagnosis not present

## 2021-04-29 DIAGNOSIS — J449 Chronic obstructive pulmonary disease, unspecified: Secondary | ICD-10-CM | POA: Diagnosis not present

## 2021-04-29 DIAGNOSIS — R350 Frequency of micturition: Secondary | ICD-10-CM | POA: Diagnosis not present

## 2021-04-29 DIAGNOSIS — Z299 Encounter for prophylactic measures, unspecified: Secondary | ICD-10-CM | POA: Diagnosis not present

## 2021-04-29 DIAGNOSIS — I779 Disorder of arteries and arterioles, unspecified: Secondary | ICD-10-CM | POA: Diagnosis not present

## 2021-04-30 DIAGNOSIS — D696 Thrombocytopenia, unspecified: Secondary | ICD-10-CM | POA: Diagnosis not present

## 2021-04-30 DIAGNOSIS — M47816 Spondylosis without myelopathy or radiculopathy, lumbar region: Secondary | ICD-10-CM | POA: Diagnosis not present

## 2021-04-30 DIAGNOSIS — K5730 Diverticulosis of large intestine without perforation or abscess without bleeding: Secondary | ICD-10-CM | POA: Diagnosis not present

## 2021-04-30 DIAGNOSIS — Z9181 History of falling: Secondary | ICD-10-CM | POA: Diagnosis not present

## 2021-04-30 DIAGNOSIS — N3090 Cystitis, unspecified without hematuria: Secondary | ICD-10-CM | POA: Diagnosis not present

## 2021-04-30 DIAGNOSIS — D72819 Decreased white blood cell count, unspecified: Secondary | ICD-10-CM | POA: Diagnosis not present

## 2021-04-30 DIAGNOSIS — R278 Other lack of coordination: Secondary | ICD-10-CM | POA: Diagnosis not present

## 2021-04-30 DIAGNOSIS — F32A Depression, unspecified: Secondary | ICD-10-CM | POA: Diagnosis not present

## 2021-04-30 DIAGNOSIS — M1612 Unilateral primary osteoarthritis, left hip: Secondary | ICD-10-CM | POA: Diagnosis not present

## 2021-04-30 DIAGNOSIS — M1712 Unilateral primary osteoarthritis, left knee: Secondary | ICD-10-CM | POA: Diagnosis not present

## 2021-04-30 DIAGNOSIS — F17210 Nicotine dependence, cigarettes, uncomplicated: Secondary | ICD-10-CM | POA: Diagnosis not present

## 2021-04-30 DIAGNOSIS — R531 Weakness: Secondary | ICD-10-CM | POA: Diagnosis not present

## 2021-05-01 DIAGNOSIS — M47816 Spondylosis without myelopathy or radiculopathy, lumbar region: Secondary | ICD-10-CM | POA: Diagnosis not present

## 2021-05-01 DIAGNOSIS — N3090 Cystitis, unspecified without hematuria: Secondary | ICD-10-CM | POA: Diagnosis not present

## 2021-05-01 DIAGNOSIS — R531 Weakness: Secondary | ICD-10-CM | POA: Diagnosis not present

## 2021-05-01 DIAGNOSIS — K5730 Diverticulosis of large intestine without perforation or abscess without bleeding: Secondary | ICD-10-CM | POA: Diagnosis not present

## 2021-05-01 DIAGNOSIS — F17210 Nicotine dependence, cigarettes, uncomplicated: Secondary | ICD-10-CM | POA: Diagnosis not present

## 2021-05-01 DIAGNOSIS — M1712 Unilateral primary osteoarthritis, left knee: Secondary | ICD-10-CM | POA: Diagnosis not present

## 2021-05-01 DIAGNOSIS — D696 Thrombocytopenia, unspecified: Secondary | ICD-10-CM | POA: Diagnosis not present

## 2021-05-01 DIAGNOSIS — M1612 Unilateral primary osteoarthritis, left hip: Secondary | ICD-10-CM | POA: Diagnosis not present

## 2021-05-01 DIAGNOSIS — D72819 Decreased white blood cell count, unspecified: Secondary | ICD-10-CM | POA: Diagnosis not present

## 2021-05-01 DIAGNOSIS — F32A Depression, unspecified: Secondary | ICD-10-CM | POA: Diagnosis not present

## 2021-05-01 DIAGNOSIS — R278 Other lack of coordination: Secondary | ICD-10-CM | POA: Diagnosis not present

## 2021-05-01 DIAGNOSIS — Z9181 History of falling: Secondary | ICD-10-CM | POA: Diagnosis not present

## 2021-05-02 DIAGNOSIS — F17210 Nicotine dependence, cigarettes, uncomplicated: Secondary | ICD-10-CM | POA: Diagnosis not present

## 2021-05-02 DIAGNOSIS — D72819 Decreased white blood cell count, unspecified: Secondary | ICD-10-CM | POA: Diagnosis not present

## 2021-05-02 DIAGNOSIS — D696 Thrombocytopenia, unspecified: Secondary | ICD-10-CM | POA: Diagnosis not present

## 2021-05-02 DIAGNOSIS — Z9181 History of falling: Secondary | ICD-10-CM | POA: Diagnosis not present

## 2021-05-02 DIAGNOSIS — F32A Depression, unspecified: Secondary | ICD-10-CM | POA: Diagnosis not present

## 2021-05-02 DIAGNOSIS — R278 Other lack of coordination: Secondary | ICD-10-CM | POA: Diagnosis not present

## 2021-05-02 DIAGNOSIS — M1712 Unilateral primary osteoarthritis, left knee: Secondary | ICD-10-CM | POA: Diagnosis not present

## 2021-05-02 DIAGNOSIS — M1612 Unilateral primary osteoarthritis, left hip: Secondary | ICD-10-CM | POA: Diagnosis not present

## 2021-05-02 DIAGNOSIS — N3090 Cystitis, unspecified without hematuria: Secondary | ICD-10-CM | POA: Diagnosis not present

## 2021-05-02 DIAGNOSIS — M47816 Spondylosis without myelopathy or radiculopathy, lumbar region: Secondary | ICD-10-CM | POA: Diagnosis not present

## 2021-05-02 DIAGNOSIS — K5730 Diverticulosis of large intestine without perforation or abscess without bleeding: Secondary | ICD-10-CM | POA: Diagnosis not present

## 2021-05-02 DIAGNOSIS — R531 Weakness: Secondary | ICD-10-CM | POA: Diagnosis not present

## 2021-05-06 DIAGNOSIS — R531 Weakness: Secondary | ICD-10-CM | POA: Diagnosis not present

## 2021-05-06 DIAGNOSIS — M1612 Unilateral primary osteoarthritis, left hip: Secondary | ICD-10-CM | POA: Diagnosis not present

## 2021-05-06 DIAGNOSIS — D72819 Decreased white blood cell count, unspecified: Secondary | ICD-10-CM | POA: Diagnosis not present

## 2021-05-06 DIAGNOSIS — Z9181 History of falling: Secondary | ICD-10-CM | POA: Diagnosis not present

## 2021-05-06 DIAGNOSIS — K5730 Diverticulosis of large intestine without perforation or abscess without bleeding: Secondary | ICD-10-CM | POA: Diagnosis not present

## 2021-05-06 DIAGNOSIS — F17210 Nicotine dependence, cigarettes, uncomplicated: Secondary | ICD-10-CM | POA: Diagnosis not present

## 2021-05-06 DIAGNOSIS — F32A Depression, unspecified: Secondary | ICD-10-CM | POA: Diagnosis not present

## 2021-05-06 DIAGNOSIS — M47816 Spondylosis without myelopathy or radiculopathy, lumbar region: Secondary | ICD-10-CM | POA: Diagnosis not present

## 2021-05-06 DIAGNOSIS — D696 Thrombocytopenia, unspecified: Secondary | ICD-10-CM | POA: Diagnosis not present

## 2021-05-06 DIAGNOSIS — R278 Other lack of coordination: Secondary | ICD-10-CM | POA: Diagnosis not present

## 2021-05-06 DIAGNOSIS — N3090 Cystitis, unspecified without hematuria: Secondary | ICD-10-CM | POA: Diagnosis not present

## 2021-05-06 DIAGNOSIS — M1712 Unilateral primary osteoarthritis, left knee: Secondary | ICD-10-CM | POA: Diagnosis not present

## 2021-05-09 DIAGNOSIS — Z9181 History of falling: Secondary | ICD-10-CM | POA: Diagnosis not present

## 2021-05-09 DIAGNOSIS — D72819 Decreased white blood cell count, unspecified: Secondary | ICD-10-CM | POA: Diagnosis not present

## 2021-05-09 DIAGNOSIS — M1712 Unilateral primary osteoarthritis, left knee: Secondary | ICD-10-CM | POA: Diagnosis not present

## 2021-05-09 DIAGNOSIS — M47816 Spondylosis without myelopathy or radiculopathy, lumbar region: Secondary | ICD-10-CM | POA: Diagnosis not present

## 2021-05-09 DIAGNOSIS — F17210 Nicotine dependence, cigarettes, uncomplicated: Secondary | ICD-10-CM | POA: Diagnosis not present

## 2021-05-09 DIAGNOSIS — M1612 Unilateral primary osteoarthritis, left hip: Secondary | ICD-10-CM | POA: Diagnosis not present

## 2021-05-09 DIAGNOSIS — D696 Thrombocytopenia, unspecified: Secondary | ICD-10-CM | POA: Diagnosis not present

## 2021-05-09 DIAGNOSIS — N3090 Cystitis, unspecified without hematuria: Secondary | ICD-10-CM | POA: Diagnosis not present

## 2021-05-09 DIAGNOSIS — R531 Weakness: Secondary | ICD-10-CM | POA: Diagnosis not present

## 2021-05-09 DIAGNOSIS — F32A Depression, unspecified: Secondary | ICD-10-CM | POA: Diagnosis not present

## 2021-05-09 DIAGNOSIS — R278 Other lack of coordination: Secondary | ICD-10-CM | POA: Diagnosis not present

## 2021-05-09 DIAGNOSIS — K5730 Diverticulosis of large intestine without perforation or abscess without bleeding: Secondary | ICD-10-CM | POA: Diagnosis not present

## 2021-05-15 DIAGNOSIS — F32A Depression, unspecified: Secondary | ICD-10-CM | POA: Diagnosis not present

## 2021-05-15 DIAGNOSIS — F17210 Nicotine dependence, cigarettes, uncomplicated: Secondary | ICD-10-CM | POA: Diagnosis not present

## 2021-05-15 DIAGNOSIS — Z9181 History of falling: Secondary | ICD-10-CM | POA: Diagnosis not present

## 2021-05-15 DIAGNOSIS — M47816 Spondylosis without myelopathy or radiculopathy, lumbar region: Secondary | ICD-10-CM | POA: Diagnosis not present

## 2021-05-15 DIAGNOSIS — K5730 Diverticulosis of large intestine without perforation or abscess without bleeding: Secondary | ICD-10-CM | POA: Diagnosis not present

## 2021-05-15 DIAGNOSIS — R531 Weakness: Secondary | ICD-10-CM | POA: Diagnosis not present

## 2021-05-15 DIAGNOSIS — D72819 Decreased white blood cell count, unspecified: Secondary | ICD-10-CM | POA: Diagnosis not present

## 2021-05-15 DIAGNOSIS — M1712 Unilateral primary osteoarthritis, left knee: Secondary | ICD-10-CM | POA: Diagnosis not present

## 2021-05-15 DIAGNOSIS — R278 Other lack of coordination: Secondary | ICD-10-CM | POA: Diagnosis not present

## 2021-05-15 DIAGNOSIS — M1612 Unilateral primary osteoarthritis, left hip: Secondary | ICD-10-CM | POA: Diagnosis not present

## 2021-05-15 DIAGNOSIS — D696 Thrombocytopenia, unspecified: Secondary | ICD-10-CM | POA: Diagnosis not present

## 2021-05-15 DIAGNOSIS — N3090 Cystitis, unspecified without hematuria: Secondary | ICD-10-CM | POA: Diagnosis not present

## 2021-05-16 DIAGNOSIS — F32A Depression, unspecified: Secondary | ICD-10-CM | POA: Diagnosis not present

## 2021-05-16 DIAGNOSIS — K5730 Diverticulosis of large intestine without perforation or abscess without bleeding: Secondary | ICD-10-CM | POA: Diagnosis not present

## 2021-05-16 DIAGNOSIS — M1612 Unilateral primary osteoarthritis, left hip: Secondary | ICD-10-CM | POA: Diagnosis not present

## 2021-05-16 DIAGNOSIS — M1712 Unilateral primary osteoarthritis, left knee: Secondary | ICD-10-CM | POA: Diagnosis not present

## 2021-05-16 DIAGNOSIS — Z9181 History of falling: Secondary | ICD-10-CM | POA: Diagnosis not present

## 2021-05-16 DIAGNOSIS — F17210 Nicotine dependence, cigarettes, uncomplicated: Secondary | ICD-10-CM | POA: Diagnosis not present

## 2021-05-16 DIAGNOSIS — D72819 Decreased white blood cell count, unspecified: Secondary | ICD-10-CM | POA: Diagnosis not present

## 2021-05-16 DIAGNOSIS — R278 Other lack of coordination: Secondary | ICD-10-CM | POA: Diagnosis not present

## 2021-05-16 DIAGNOSIS — D696 Thrombocytopenia, unspecified: Secondary | ICD-10-CM | POA: Diagnosis not present

## 2021-05-16 DIAGNOSIS — M47816 Spondylosis without myelopathy or radiculopathy, lumbar region: Secondary | ICD-10-CM | POA: Diagnosis not present

## 2021-05-16 DIAGNOSIS — N3090 Cystitis, unspecified without hematuria: Secondary | ICD-10-CM | POA: Diagnosis not present

## 2021-05-16 DIAGNOSIS — R531 Weakness: Secondary | ICD-10-CM | POA: Diagnosis not present

## 2021-05-19 DIAGNOSIS — R278 Other lack of coordination: Secondary | ICD-10-CM | POA: Diagnosis not present

## 2021-05-19 DIAGNOSIS — R531 Weakness: Secondary | ICD-10-CM | POA: Diagnosis not present

## 2021-05-19 DIAGNOSIS — F17210 Nicotine dependence, cigarettes, uncomplicated: Secondary | ICD-10-CM | POA: Diagnosis not present

## 2021-05-19 DIAGNOSIS — D696 Thrombocytopenia, unspecified: Secondary | ICD-10-CM | POA: Diagnosis not present

## 2021-05-19 DIAGNOSIS — M1712 Unilateral primary osteoarthritis, left knee: Secondary | ICD-10-CM | POA: Diagnosis not present

## 2021-05-19 DIAGNOSIS — N3090 Cystitis, unspecified without hematuria: Secondary | ICD-10-CM | POA: Diagnosis not present

## 2021-05-19 DIAGNOSIS — M47816 Spondylosis without myelopathy or radiculopathy, lumbar region: Secondary | ICD-10-CM | POA: Diagnosis not present

## 2021-05-19 DIAGNOSIS — F32A Depression, unspecified: Secondary | ICD-10-CM | POA: Diagnosis not present

## 2021-05-19 DIAGNOSIS — Z9181 History of falling: Secondary | ICD-10-CM | POA: Diagnosis not present

## 2021-05-19 DIAGNOSIS — M1612 Unilateral primary osteoarthritis, left hip: Secondary | ICD-10-CM | POA: Diagnosis not present

## 2021-05-19 DIAGNOSIS — D72819 Decreased white blood cell count, unspecified: Secondary | ICD-10-CM | POA: Diagnosis not present

## 2021-05-19 DIAGNOSIS — K5730 Diverticulosis of large intestine without perforation or abscess without bleeding: Secondary | ICD-10-CM | POA: Diagnosis not present

## 2021-05-29 IMAGING — US US EXTREM LOW VENOUS*R*
1 series · 13 of 24 positions shown · non-contrast
Comparison: None.

CLINICAL DATA: 73-year-old male with right lower extremity pain and
edema, right knee surgery 1 week previously



[Series 1: us extrem low venous*right* · 13 of 36 slices shown]
[im 1/36]
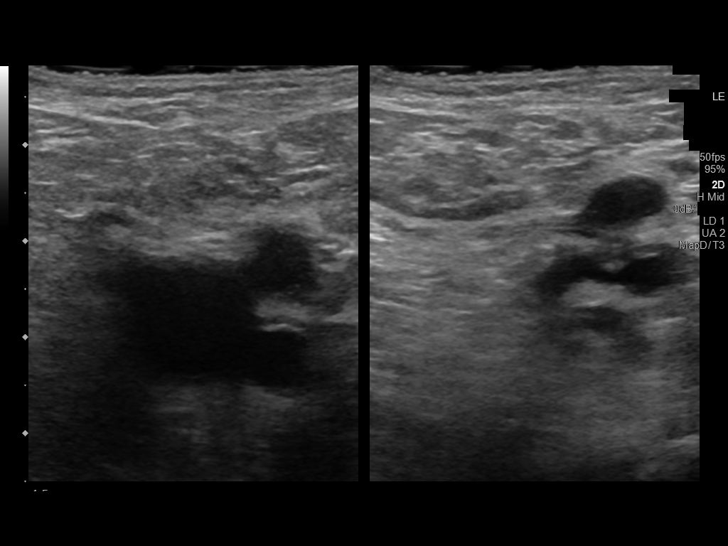
[im 4/36]
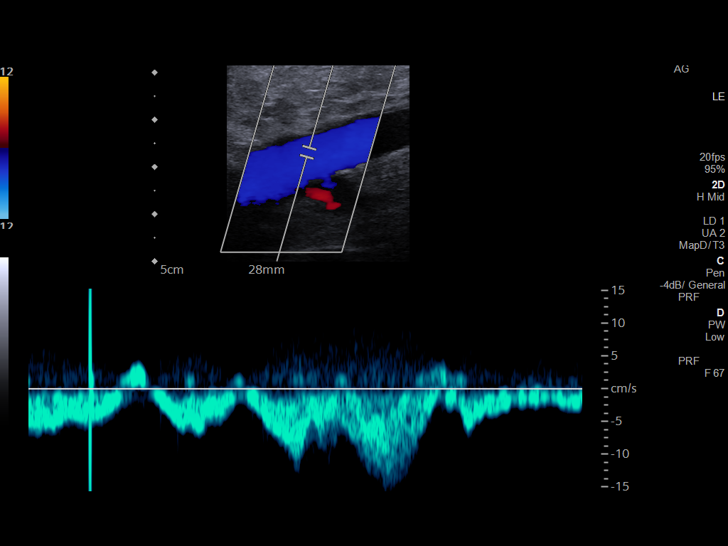
[im 7/36]
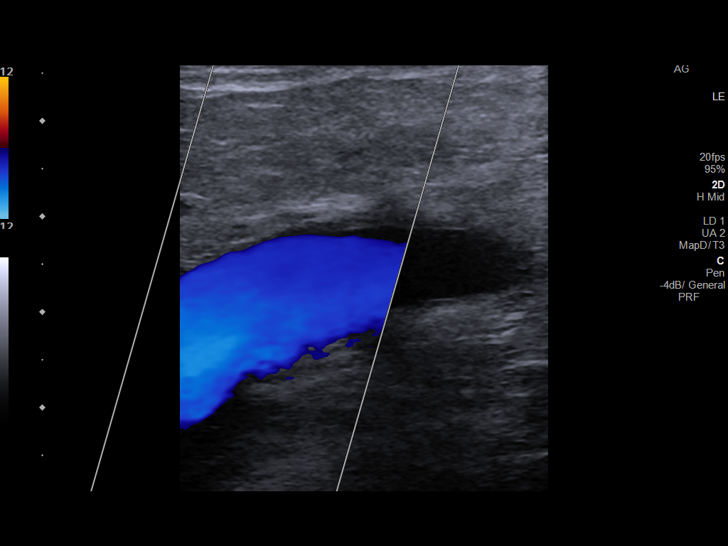
[im 10/36]
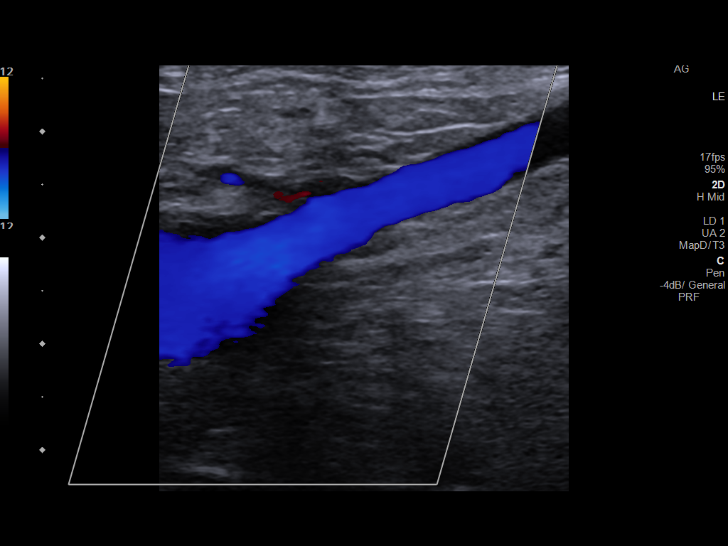
[im 13/36]
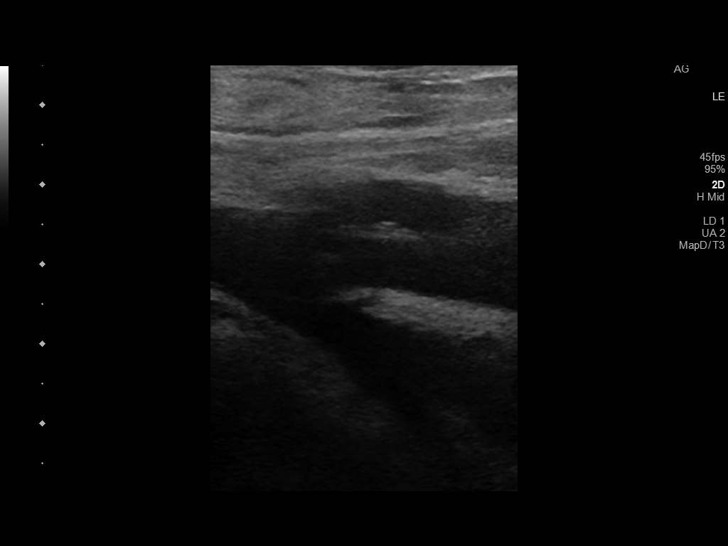
[im 16/36]
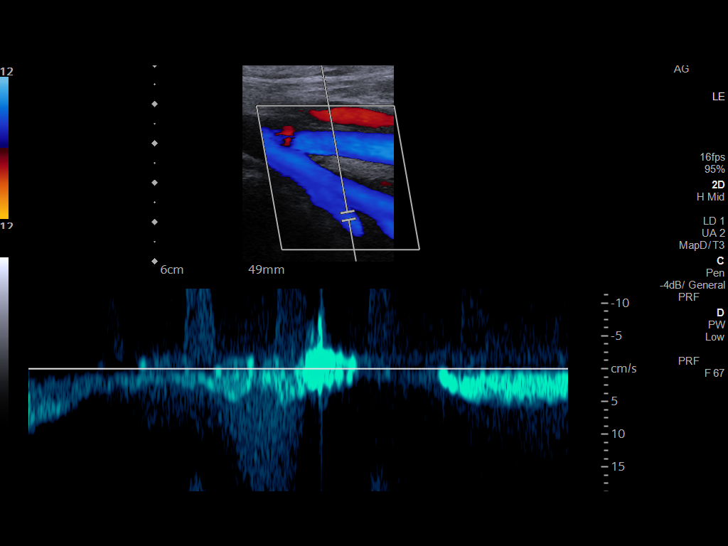
[im 19/36]
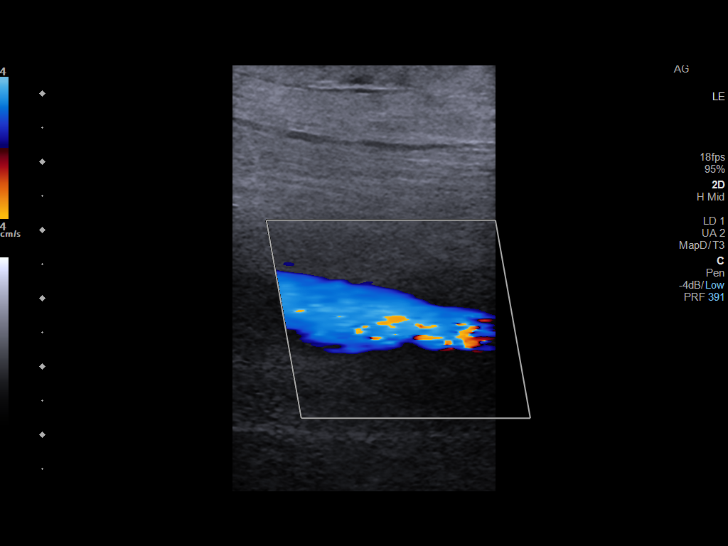
[im 20/36]
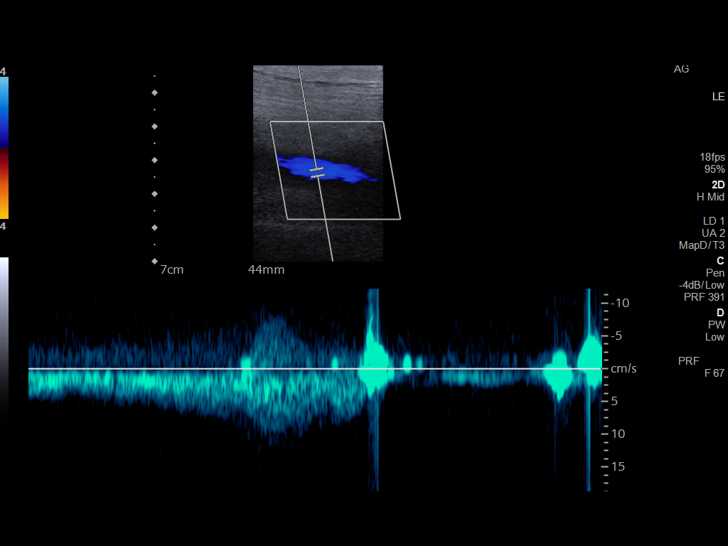
[im 23/36]
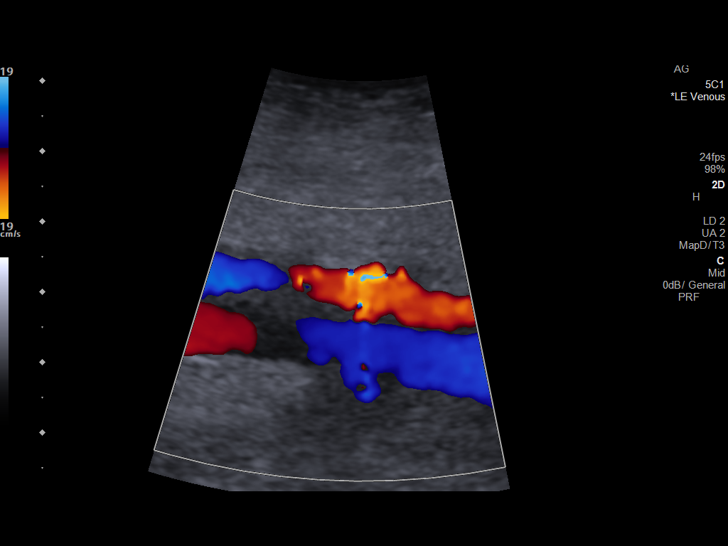
[im 26/36]
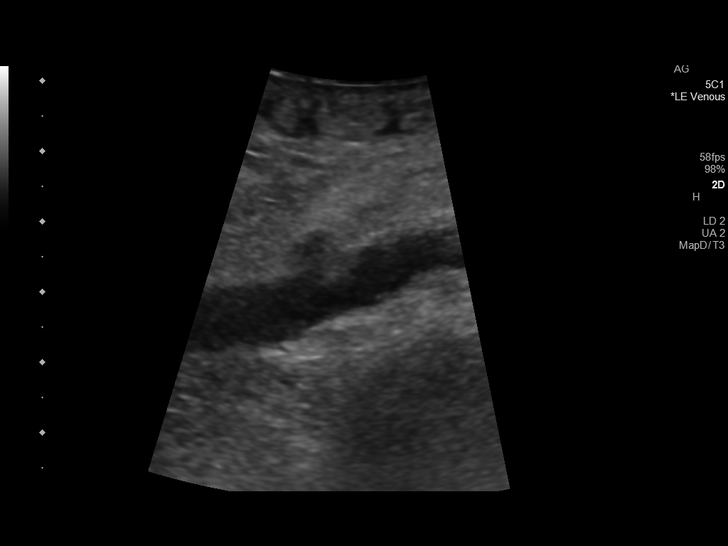
[im 29/36]
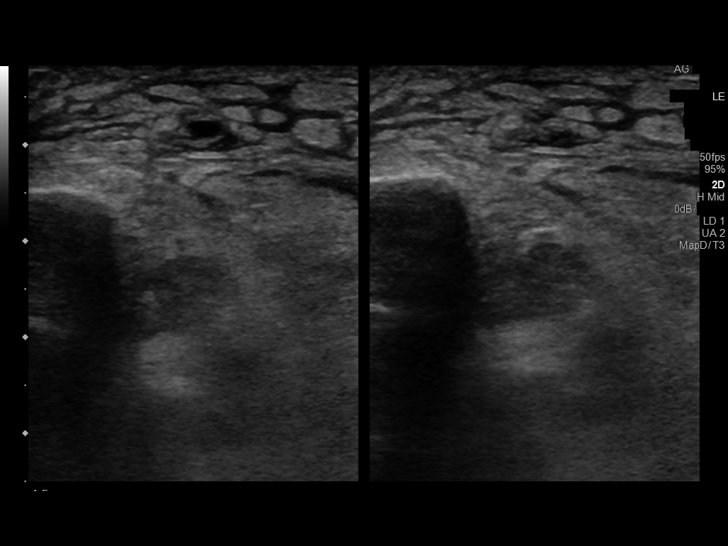
[im 32/36]
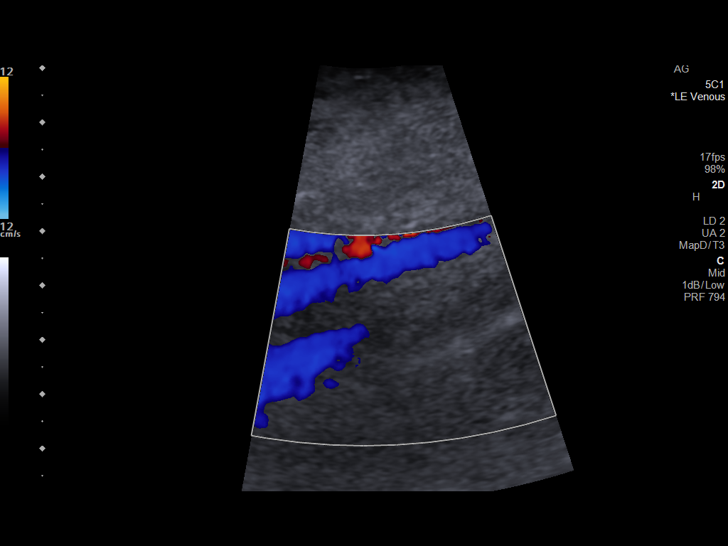
[im 36/36]
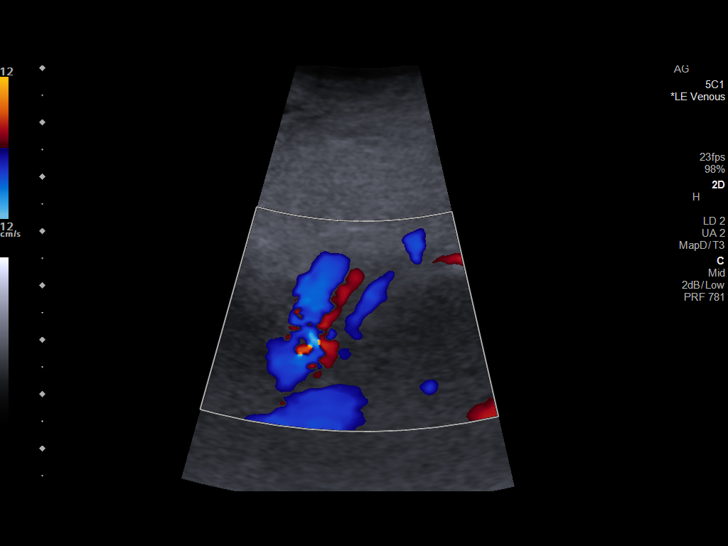

[13 of 24 positions shown; findings below may reference images not displayed]

FINDINGS: Contralateral Common Femoral Vein: Respiratory phasicity is normal
and symmetric with the symptomatic side. No evidence of thrombus.
Normal compressibility.

Common Femoral Vein: No evidence of thrombus. Normal
compressibility, respiratory phasicity and response to augmentation.

Saphenofemoral Junction: No evidence of thrombus. Normal
compressibility and flow on color Doppler imaging.

Profunda Femoral Vein: No evidence of thrombus. Normal
compressibility and flow on color Doppler imaging.

Femoral Vein: No evidence of thrombus. Normal compressibility,
respiratory phasicity and response to augmentation.

Popliteal Vein: No evidence of thrombus. Normal compressibility,
respiratory phasicity and response to augmentation.

Calf Veins: No evidence of thrombus. Normal compressibility and flow
on color Doppler imaging.

Superficial Great Saphenous Vein: No evidence of thrombus. Normal
compressibility.

Venous Reflux:  None.

Other Findings:  Superficial subcutaneous edema versus cellulitis.
IMPRESSION: No evidence of deep venous thrombosis.

Superficial subcutaneous edema versus cellulitis.
